# Patient Record
Sex: Male | Born: 1975
Health system: Southern US, Community
[De-identification: ages and names within clinical notes are randomized; demographics above are authoritative.]

## PROBLEM LIST (undated history)

## (undated) DIAGNOSIS — E559 Vitamin D deficiency, unspecified: Secondary | ICD-10-CM

## (undated) DIAGNOSIS — J309 Allergic rhinitis, unspecified: Secondary | ICD-10-CM

## (undated) DIAGNOSIS — B009 Herpesviral infection, unspecified: Secondary | ICD-10-CM

## (undated) DIAGNOSIS — G5621 Lesion of ulnar nerve, right upper limb: Secondary | ICD-10-CM

## (undated) DIAGNOSIS — G43909 Migraine, unspecified, not intractable, without status migrainosus: Secondary | ICD-10-CM

## (undated) DIAGNOSIS — E782 Mixed hyperlipidemia: Secondary | ICD-10-CM

## (undated) DIAGNOSIS — T7840XA Allergy, unspecified, initial encounter: Secondary | ICD-10-CM

## (undated) DIAGNOSIS — E039 Hypothyroidism, unspecified: Secondary | ICD-10-CM

## (undated) DIAGNOSIS — Z87442 Personal history of urinary calculi: Secondary | ICD-10-CM

## (undated) HISTORY — DX: Hypothyroidism, unspecified: E03.9

## (undated) HISTORY — DX: Allergy, unspecified, initial encounter: T78.40XA

## (undated) HISTORY — DX: Herpesviral infection, unspecified: B00.9

## (undated) HISTORY — DX: Lesion of ulnar nerve, right upper limb: G56.21

## (undated) HISTORY — DX: Mixed hyperlipidemia: E78.2

## (undated) HISTORY — DX: Vitamin D deficiency, unspecified: E55.9

## (undated) HISTORY — DX: Migraine, unspecified, not intractable, without status migrainosus: G43.909

## (undated) HISTORY — DX: Allergic rhinitis, unspecified: J30.9

## (undated) HISTORY — DX: Personal history of urinary calculi: Z87.442

## (undated) HISTORY — PX: NOSE SURGERY: SHX723

---

## 2012-05-24 DIAGNOSIS — E039 Hypothyroidism, unspecified: Secondary | ICD-10-CM | POA: Insufficient documentation

## 2014-06-12 DIAGNOSIS — E559 Vitamin D deficiency, unspecified: Secondary | ICD-10-CM | POA: Insufficient documentation

## 2014-06-12 DIAGNOSIS — E785 Hyperlipidemia, unspecified: Secondary | ICD-10-CM | POA: Insufficient documentation

## 2014-06-12 DIAGNOSIS — B009 Herpesviral infection, unspecified: Secondary | ICD-10-CM | POA: Insufficient documentation

## 2016-08-02 DIAGNOSIS — G43109 Migraine with aura, not intractable, without status migrainosus: Secondary | ICD-10-CM | POA: Insufficient documentation

## 2017-05-24 ENCOUNTER — Ambulatory Visit
Admission: RE | Admit: 2017-05-24 | Discharge: 2017-05-24 | Disposition: A | Discharge status: Home / Self Care | Payer: 59 | Source: Ambulatory Visit | Attending: Physician Assistant | Admitting: Physician Assistant

## 2017-05-24 ENCOUNTER — Other Ambulatory Visit: Payer: Self-pay | Admitting: Physician Assistant

## 2017-05-24 DIAGNOSIS — M79601 Pain in right arm: Secondary | ICD-10-CM

## 2017-05-24 DIAGNOSIS — G5691 Unspecified mononeuropathy of right upper limb: Secondary | ICD-10-CM

## 2017-06-19 ENCOUNTER — Other Ambulatory Visit: Payer: Self-pay | Admitting: Orthopedic Surgery

## 2017-06-19 DIAGNOSIS — M5412 Radiculopathy, cervical region: Secondary | ICD-10-CM

## 2017-06-19 DIAGNOSIS — R0789 Other chest pain: Secondary | ICD-10-CM

## 2017-06-19 DIAGNOSIS — G5621 Lesion of ulnar nerve, right upper limb: Secondary | ICD-10-CM

## 2017-07-02 ENCOUNTER — Ambulatory Visit
Admission: RE | Admit: 2017-07-02 | Discharge: 2017-07-02 | Disposition: A | Discharge status: Home / Self Care | Payer: 59 | Source: Ambulatory Visit | Attending: Orthopedic Surgery | Admitting: Orthopedic Surgery

## 2017-07-02 DIAGNOSIS — M5412 Radiculopathy, cervical region: Secondary | ICD-10-CM

## 2017-07-02 DIAGNOSIS — R0789 Other chest pain: Secondary | ICD-10-CM

## 2017-07-05 ENCOUNTER — Other Ambulatory Visit: Payer: 59

## 2017-07-07 ENCOUNTER — Other Ambulatory Visit: Payer: 59

## 2017-08-10 ENCOUNTER — Ambulatory Visit (INDEPENDENT_AMBULATORY_CARE_PROVIDER_SITE_OTHER): Payer: 59 | Admitting: Physical Therapy

## 2017-08-10 ENCOUNTER — Encounter: Payer: Self-pay | Admitting: Physical Therapy

## 2017-08-10 DIAGNOSIS — M79601 Pain in right arm: Secondary | ICD-10-CM

## 2017-08-10 DIAGNOSIS — M6281 Muscle weakness (generalized): Secondary | ICD-10-CM

## 2017-08-10 NOTE — Therapy (Signed)
[facility] [address] [place], [place], [address] Phone: [phone]   Fax:  [phone]  Physical Therapy Evaluation  Patient Details  Name: Joseph MaineJoseph Stockert MRN: [id number] Date of Birth: 17-Oct-1976 Referring Provider: Dr Dominica SeverinWilliam Gramig  Encounter Date: 08/10/2017      PT End of Session - 08/10/17 0938    Visit Number 1   Number of Visits 4   Date for PT Re-Evaluation 09/07/17   PT Start Time 0939   PT Stop Time 1023   PT Time Calculation (min) 44 min   Activity Tolerance Patient tolerated treatment well      Past Medical History:  Diagnosis Date  . Allergy   . Hypothyroid     Past Surgical History:  Procedure Laterality Date  . NOSE SURGERY      There were no vitals filed for this visit.       Subjective Assessment - 08/10/17 0939    Subjective Pt reports about 5 months ago he was playing tennis (usually plays softball) when he went to serve he felt a different feeling.  A couple days later and had difficulty throwing the ball with shooting pain into his pinky. He had numbness and edema into the hand after that, it is slowly decreasing, is just starting to get feeling back in his hand. He has been on long doses of prednisone - no relief, anti - inflammatory no help.    Pertinent History Lt sided neck injury in [employer] with wrestling.    How long can you sit comfortably? tolerates about 60 min   Diagnostic tests 3 MRIs from neck down to hand - none of these showed a tear in the nerves    Patient Stated Goals return to normal at work, play softball and golf   Currently in Pain? Yes   Pain Score 2    Pain Location Hand  ulnar side   Pain Orientation Right   Pain Type Acute pain   Pain Radiating Towards only into upper arm with certain movements   Pain Onset More than a month ago   Pain Frequency Constant   Aggravating Factors  certain movements   Pain Relieving Factors ice            [facility]  PT Assessment - 08/10/17 0001      Assessment   Medical Diagnosis Rt cervical radiculapathy, ulnar nerve restrictions   Referring Provider Dr Dominica SeverinWilliam Gramig   Onset Date/Surgical Date 03/10/17   Hand Dominance Right   Next MD Visit 10/13/17   Prior Therapy none     Precautions   Precautions None  no throwing until healed     Balance Screen   Has the patient fallen in the past 6 months No     Prior Function   Level of Independence Independent   Vocation Full time employment   Market researcherVocation Requirements desk computer job   Leisure play softball golf, play with child     Observation/Other Assessments   Focus on Therapeutic Outcomes (FOTO)  46% limited     Sensation   Light Touch Impaired by gross assessment   Hot/Cold Appears Intact   Proprioception Appears Intact     Posture/Postural Control   Posture/Postural Control No significant limitations     ROM / Strength   AROM / PROM / Strength AROM;Strength     AROM   AROM Assessment Site Shoulder;Elbow;Cervical   Right/Left Shoulder --  WNL   Right/Left Elbow --  WNL   Cervical Flexion WNL   Cervical Extension WNL   Cervical - Right Side Bend WNL   Cervical - Left Side Bend WNL   Cervical - Right Rotation WNL   Cervical - Left Rotation WNL     Strength   Strength Assessment Site Shoulder;Elbow;Hand   Right/Left Shoulder --  Lt WNL, Rt WNL except abduction/ER 4+/5   Right/Left Elbow --  Lt WNL, Rt bicep WNL, tricep 4+/5   Right/Left hand Right;Left   Right Hand Grip (lbs) 73   Left Hand Grip (lbs) 84     Flexibility   Soft Tissue Assessment /Muscle Length --  some tightness in bilat pecs     Palpation   Palpation comment nodule under arm near axilla that is very painful     Special Tests    Special Tests --  (-) spurlings             Objective measurements completed on examination: See above findings.          [facility] Adult PT Treatment/Exercise - 08/10/17 0001      Exercises   Exercises Elbow      Elbow Exercises   Other elbow exercises ulnar nerve root stretches, 5-10 reps 3 sec holds, two different ones per HEP   Other elbow exercises grip      Modalities   Modalities Cryotherapy     Cryotherapy   Number Minutes Cryotherapy 10 Minutes   Cryotherapy Location --  Rt inner arm/axilla   Type of Cryotherapy Ice pack                     PT Long Term Goals - 08/10/17 0935      PT LONG TERM GOAL #1   Title I with advanced HEP ( 09/07/17)    Time 4   Period Weeks   Status New     PT LONG TERM GOAL #2   Title report =/> 75% reduction of pain/numbness into his Rt hand( 09/07/17)    Time 4   Period Weeks   Status New     PT LONG TERM GOAL #3   Title improve FOTO =/< 30% limited ( 09/07/17)    Time 4   Period Weeks   Status New     PT LONG TERM GOAL #4   Title improve grip Rt hand to within 5# of Lt with reports of not dropping items anymore (  09/07/17)    Time 4   Period Weeks   Status New     PT LONG TERM GOAL #5   Title improve Rt shoulder abduction and ER strenght 5/5 to allow him to return to light throwing ( 09/07/17)    Time 4   Period Weeks   Status New                Plan - 08/10/17 1104    Clinical Impression Statement 41 yo male ~ 5 months s/p Rt UE nerve injury.  He reports improvement in function and sensation over the last 5 months however still has defecits with 39 UE strength and sensation.  He has (+) ulnar nerve glide restrictions on the Rt side, residual edema into the hand and a nodule on the upper inner arm that is very tender.    Clinical Presentation Evolving   Clinical Decision Making Low   Rehab Potential Excellent   PT Frequency 1x / week   PT Duration 4 weeks   PT  Treatment/Interventions Ultrasound;Therapeutic exercise;Manual techniques;Cryotherapy;Electrical Stimulation;Patient/family education;Passive range of motion   PT Next Visit Plan Rt UE strengthening, neural glides, possible [place] to nodule on inner Rt upper  arm.    Consulted and Agree with Plan of Care Patient      Patient will benefit from skilled therapeutic intervention in order to improve the following deficits and impairments:  Pain, Decreased strength, Increased edema, Impaired UE functional use  Visit Diagnosis: Pain in right arm - Plan: PT plan of care cert/re-cert  Muscle weakness (generalized) - Plan: PT plan of care cert/re-cert     Problem List There are no active problems to display for this patient.   Roderic Scarce PT  08/10/2017, 11:17 AM  [facility] [address] [place], [place], [address] Phone: [phone]   Fax:  [phone]  Name: Weslee Fogg MRN: [id number] Date of Birth: Jul 31, 1976

## 2017-08-16 ENCOUNTER — Encounter: Payer: 59 | Admitting: Physical Therapy

## 2017-08-17 ENCOUNTER — Ambulatory Visit (INDEPENDENT_AMBULATORY_CARE_PROVIDER_SITE_OTHER): Payer: 59 | Admitting: Physical Therapy

## 2017-08-17 DIAGNOSIS — M6281 Muscle weakness (generalized): Secondary | ICD-10-CM

## 2017-08-17 DIAGNOSIS — M79601 Pain in right arm: Secondary | ICD-10-CM

## 2017-08-17 NOTE — Patient Instructions (Signed)
handmaster plus  [email or web address]://www.amazon.com/Handmaster-Plus-Physical-Therapy-Exerciser/dp/B00CTG3TQU  * compression sleeve to control swelling  Adduction (Resistive Putty)    Press between 2 fingers and pull putty anchored with other hand. Repeat with all fingers. Repeat __10-30__ times. Do ____ sessions per day.   Abduction / Adduction (Active)    With hand flat on table, spread all fingers apart, then bring them together as close as possible.  Use rubber band.  Repeat __30__ times. Do __2__ sessions per day.  (Home) Extension: Short Head Triceps Press    Facing anchor, elbows against sides. Push down on bar, straightening arms, keeping elbows at sides. Repeat __10__ times per set. Do __2-3__ sets per session. Do _3___ sessions per week.  Resisted External Rotation: in Neutral - Bilateral  PALMS UP!!! Sit or stand, tubing in both hands, elbows at sides, bent to 90, forearms forward. Pinch shoulder blades together and rotate forearms out. Keep elbows at sides. Repeat __10__ times per set. Do __2-3__ sets per session. Do __3-4__ sessions per week.   Strengthening: Resisted Extension   Hold tubing with both hands, arms forward. Pull arms back, elbow straight. Repeat _10-30___ times per set. Do ____ sets per session. Do _1___ sessions per day.  Abduction: Scaption - Thumb Up (Dumbbell)    Lift arms out from sides, thumbs up.  RED BAND  Repeat _10___ times per set. Do _2-3___ sets per session. Do __3__ sessions per week.    Kinesiology tape What is kinesiology tape?  There are many brands of kinesiology tape.  KTape, Rock Eaton Corporationape, [employer]Body Sport, Dynamic tape, to name a few. It is an elasticized tape designed to support the body's natural healing process. This tape provides stability and support to muscles and joints without restricting motion. It can also help decrease swelling in the area of application. How does it work? The tape microscopically lifts and decompresses the  skin to allow for drainage of lymph (swelling) to flow away from area, reducing inflammation.  The tape has the ability to help re-educate the neuromuscular system by targeting specific receptors in the skin.  The presence of the tape increases the body's awareness of posture and body mechanics.  Do not use with: . Open wounds . Skin lesions . Adhesive allergies Safe removal of the tape: In some rare cases, mild/moderate skin irritation can occur.  This can include redness, itchiness, or hives. If this occurs, immediately remove tape and consult your primary care physician if symptoms are severe or do not resolve within 2 days.  To remove tape safely, hold nearby skin with one hand and gentle roll tape down with other hand.  You can apply oil or conditioner to tape while in shower prior to removal to loosen adhesive.  DO NOT swiftly rip tape off like a band-aid, as this could cause skin tears and additional skin irritation.

## 2017-08-17 NOTE — Therapy (Addendum)
[address] [place], [place], [address] Phone: [phone]   Fax:  [phone]  Physical Therapy Treatment  Patient Details  Name: Joseph Stephens MRN: [id number] Date of Birth: January 05, 1976 Referring Provider: Dr Roseanne Kaufman  Encounter Date: 08/17/2017      PT End of Session - 08/17/17 1119    Visit Number 2   Number of Visits 4   Date for PT Re-Evaluation 09/07/17   PT Start Time 1245   PT Stop Time 1114   PT Time Calculation (min) 56 min   Activity Tolerance Patient tolerated treatment well;No increased pain   Behavior During Therapy WFL for tasks assessed/performed      Past Medical History:  Diagnosis Date  . Allergy   . Hypothyroid     Past Surgical History:  Procedure Laterality Date  . NOSE SURGERY      There were no vitals filed for this visit.      Subjective Assessment - 08/17/17 1021    Subjective Pt reports he was working with crimp tool this weekend, running cables; had some increased swelling in RUE and increased numbness afterward.     Currently in Pain? No/denies  numbness in 4th and 5th finger of Rt hand            [facility] PT Assessment - 08/17/17 0001      Strength   Right Hand Grip (lbs) 88, 90   Left Hand Grip (lbs) 80, 82          [facility] Adult PT Treatment/Exercise - 08/17/17 0001      Self-Care   Self-Care Other Self-Care Comments   Other Self-Care Comments  Pt educated on use of compression sleeve for RUE to asist in edema control/reduction with use of RUE.  Pt verbalized understanding.      Elbow Exercises   Elbow Extension Right;10 reps;Standing  shoulder neutralx 5, at 90 deg x10, 110 deg x 10     Shoulder Exercises: Standing   External Rotation Strengthening;10 reps;Theraband  5 reps with green, too hard. moved to red   Theraband Level (Shoulder External Rotation) Level 2 (Red)   ABduction Strengthening;Right;10 reps   Theraband Level (Shoulder  ABduction) Level 2 (Red)   Other Standing Exercises Rt ulnar nerve glide x 5 reps; Rt tricep stretch x 30 sec x 2 reps      Hand Exercises   Digit Abduction/Adduction 30 x RUE   Other Hand Exercises handmaster plus- soft squeeze and finger ext x 30 reps      Modalities   Modalities Ultrasound     Ultrasound   Ultrasound Location Rt mid arm (medial side of arm) near nodule   Ultrasound Parameters 50%, 1.1w/cm2, 8 min    Ultrasound Goals Pain     Manual Therapy   Manual Therapy Taping   Manual therapy comments I strip of Rock tape applied to Rt hypothenar emminance and up medial arm to mid-arm (along line of humerus) with 15% stretch; perpendicular strip applied at wrist and over nodule with 50% stretch - tape applied to increase proprioception, decompress area                PT Education - 08/17/17 1118    Education provided Yes   Education Details HEP, info on handmaster plus and kinesiotape. pt issued red/green band, stress squeeze ball, rubber band, and piece of Rock tape.    Person(s) Educated Patient   Methods Explanation;Demonstration;Verbal cues;Handout   Comprehension  Verbalized understanding;Returned demonstration             PT Long Term Goals - 08/17/17 1120      PT LONG TERM GOAL #1   Title I with advanced HEP ( 09/07/17)    Time 4   Period Weeks   Status On-going     PT LONG TERM GOAL #2   Title report =/> 75% reduction of pain/numbness into his Rt hand( 09/07/17)    Time 4   Period Weeks   Status On-going     PT LONG TERM GOAL #3   Title improve FOTO =/< 30% limited ( 09/07/17)    Time 4   Period Weeks   Status On-going     PT LONG TERM GOAL #4   Title improve grip Rt hand to within 5# of Lt with reports of not dropping items anymore (  09/07/17)    Time 4   Period Weeks   Status Partially Met     PT LONG TERM GOAL #5   Title improve Rt shoulder abduction and ER strenght 5/5 to allow him to return to light throwing ( 09/07/17)    Time 4    Period Weeks   Status On-going               Plan - 08/17/17 1129    Clinical Impression Statement Pt demonstrated improved Rt hand strength; has partially met LTG#4.  He tolerated all exercises well without increase in symptoms.  He's making good gains towards goals.    Rehab Potential Excellent   PT Frequency 1x / week   PT Duration 4 weeks   PT Treatment/Interventions Ultrasound;Therapeutic exercise;Manual techniques;Cryotherapy;Electrical Stimulation;Patient/family education;Passive range of motion   PT Next Visit Plan assess response to [facility] tape/US/ and new HEP.   Progress RUE strengthening as tolerated.    Consulted and Agree with Plan of Care Patient      Patient will benefit from skilled therapeutic intervention in order to improve the following deficits and impairments:  Pain, Decreased strength, Increased edema, Impaired UE functional use  Visit Diagnosis: Pain in right arm  Muscle weakness (generalized)     Problem List There are no active problems to display for this patient.  Kerin Perna, PTA 08/17/17 11:32 AM  [place], [place], [address] Phone: [phone]   Fax:  [phone]  Name: Joseph Stephens MRN: [id number] Date of Birth: Oct 30, 1976   PHYSICAL THERAPY DISCHARGE SUMMARY  Visits from Start of Care: 2 Current functional level related to goals / functional outcomes: unknown   Remaining deficits: unknown   Education / Equipment: HEP Plan:                                                    Patient goals were not met. Patient is being discharged due to not returning since the last visit.  ?????     Jeral Pinch, PT 11/08/17 7:12 AM

## 2017-08-24 ENCOUNTER — Encounter: Payer: 59 | Admitting: Physical Therapy

## 2018-01-26 LAB — BASIC METABOLIC PANEL
BUN: 13 (ref 4–21)
Creatinine: 0.9 (ref 0.6–1.3)
Glucose: 95
Potassium: 4.2 (ref 3.4–5.3)
Sodium: 140 (ref 137–147)

## 2018-01-26 LAB — CBC AND DIFFERENTIAL
HCT: 42 (ref 41–53)
Hemoglobin: 14.5 (ref 13.5–17.5)
Neutrophils Absolute: 4
Platelets: 251 (ref 150–399)
WBC: 7.2

## 2018-01-26 LAB — HEPATIC FUNCTION PANEL
ALT: 32 (ref 10–40)
AST: 25 (ref 14–40)
Alkaline Phosphatase: 66 (ref 25–125)
Bilirubin, Total: 0.6

## 2018-01-26 LAB — LIPID PANEL
Cholesterol: 166 (ref 0–200)
HDL: 55 (ref 35–70)
LDL Cholesterol: 88
Triglycerides: 116 (ref 40–160)

## 2018-01-26 LAB — VITAMIN D 25 HYDROXY (VIT D DEFICIENCY, FRACTURES): Vit D, 25-Hydroxy: 37.1

## 2018-01-26 LAB — TSH: TSH: 2.27 (ref 0.41–5.90)

## 2018-05-02 DIAGNOSIS — M79641 Pain in right hand: Secondary | ICD-10-CM | POA: Insufficient documentation

## 2018-05-02 DIAGNOSIS — G5620 Lesion of ulnar nerve, unspecified upper limb: Secondary | ICD-10-CM | POA: Insufficient documentation

## 2018-11-12 MED FILL — LEVOTHYROXINE 50 MCG TABLET: 50 | 90 days supply | Qty: 90 | Fill #0

## 2018-11-27 IMAGING — MR MR HUMERUS*R* W/O CM
4 of 5 series · 26 of 40 positions shown · non-contrast
Comparison: None.

CLINICAL DATA: Right medial humerus pain and numbness radiating to
the pinky and finger x6 +weeks.

EXAM:
MRI OF THE RIGHT HUMERUS WITHOUT CONTRAST
TECHNIQUE: Multiplanar, multisequence MR imaging of the right humerus was
performed. No intravenous contrast was administered.

[Series 4: T2 fat-sat · axial · right · 5.5mm · 0.67mm/px · z∈[-227,+127]mm · 9 of 51 slices shown (1 of 3)]
[im 1/51]
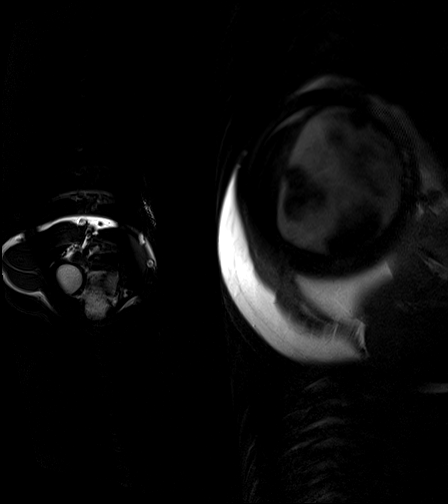
[im 10/51]
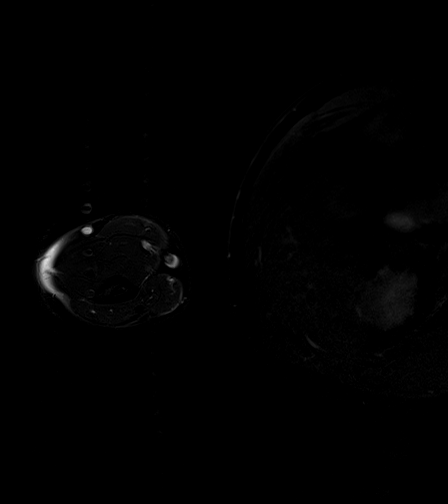
[im 14/51]
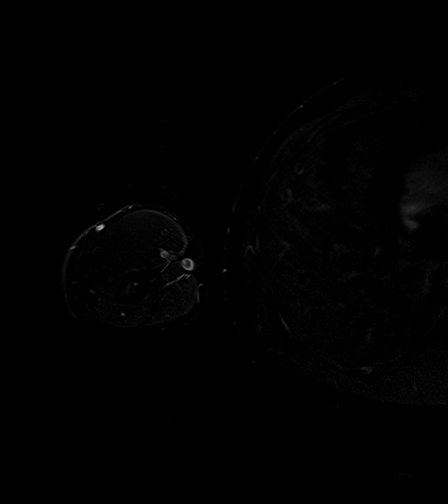
[im 23/51]
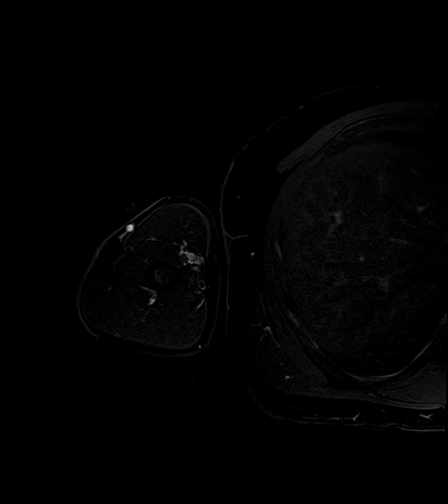
[im 28/51]
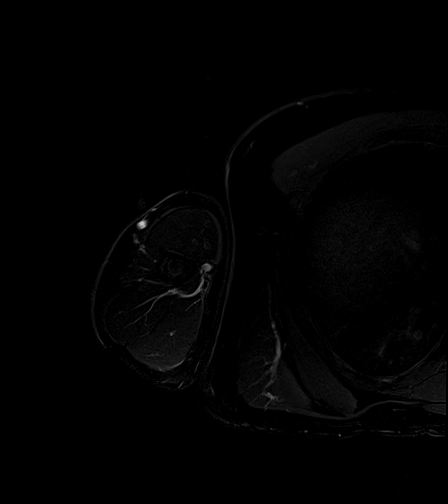
[im 37/51]
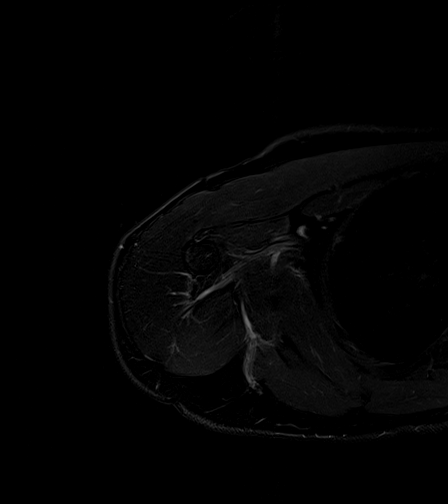
[im 41/51]
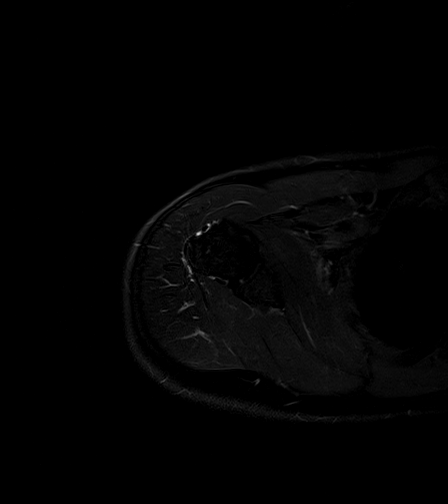
[im 46/51]
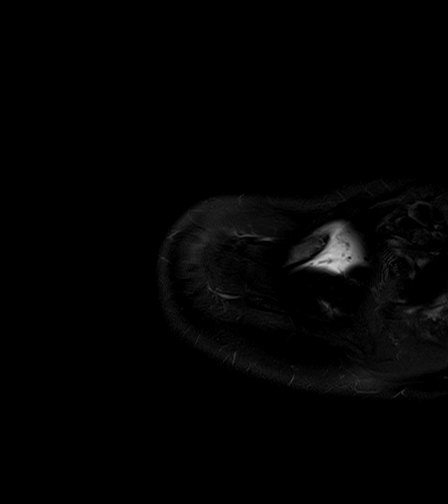
[im 51/51]
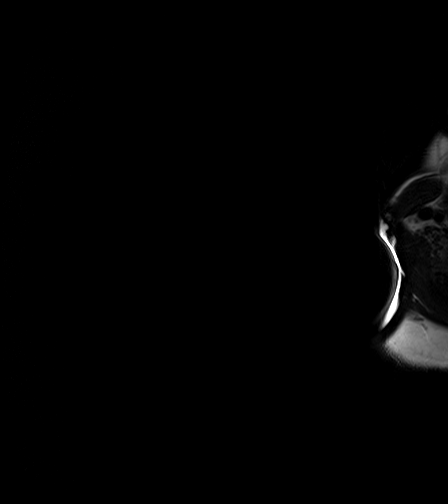

[Series 5: T1 · sagittal · right · 3.0mm · 0.85mm/px · 3 of 29 slices shown]
[im 5/29]
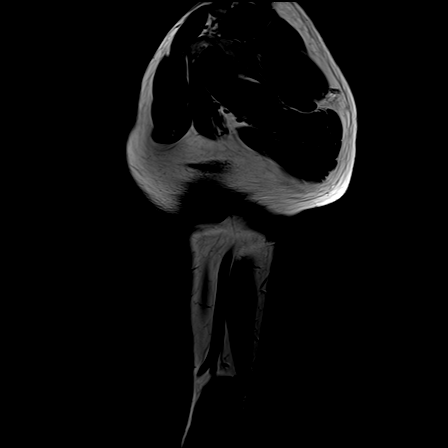
[im 15/29]
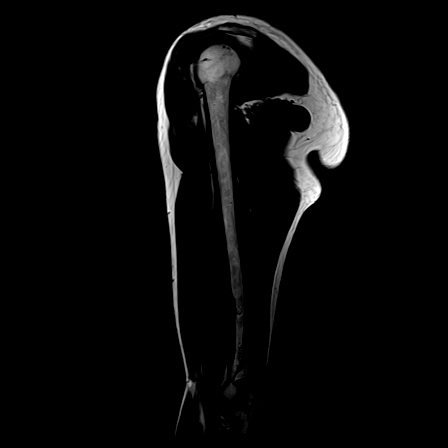
[im 24/29]
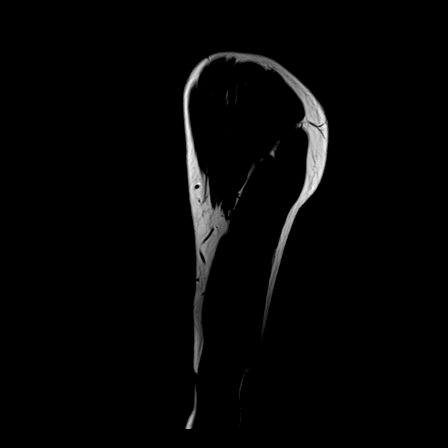

[Series 6: T2 fat-sat · sagittal · right · 3.0mm · 0.85mm/px · 7 of 29 slices shown (2 of 3)]
[im 1/29]
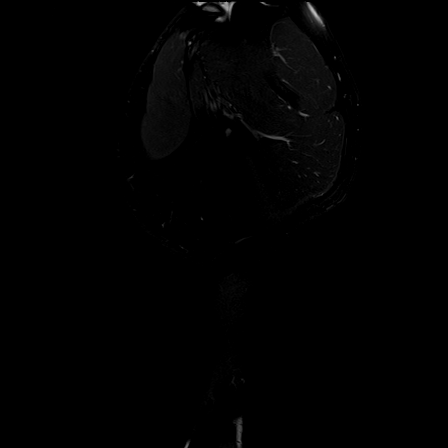
[im 5/29]
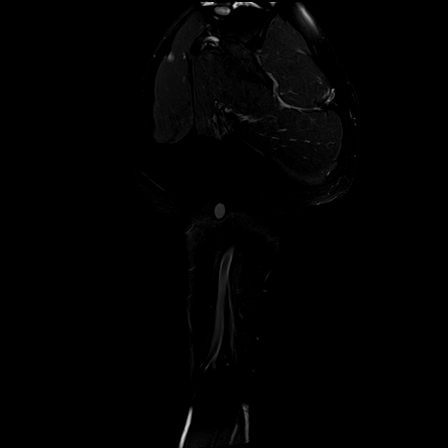
[im 10/29]
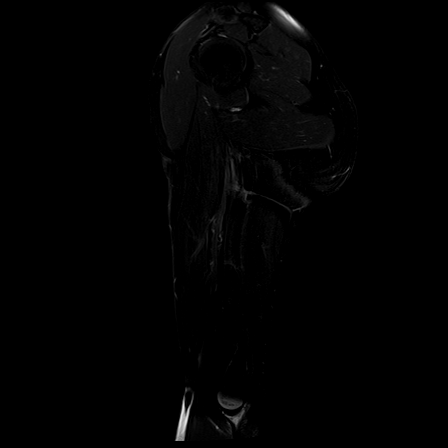
[im 15/29]
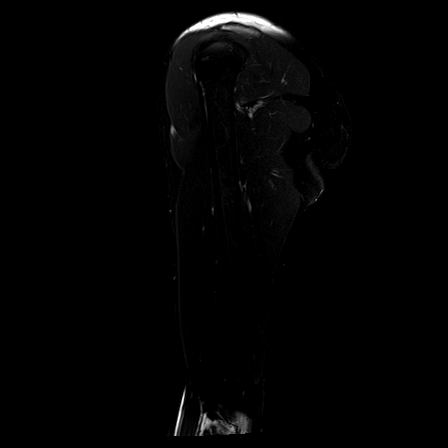
[im 19/29]
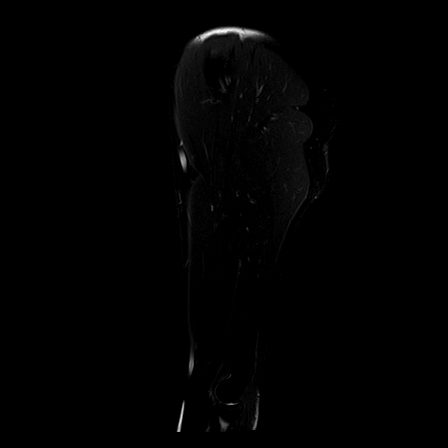
[im 24/29]
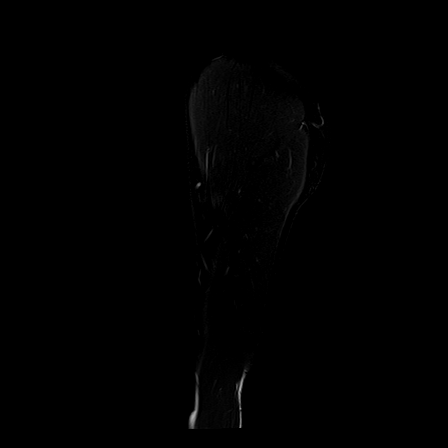
[im 29/29]
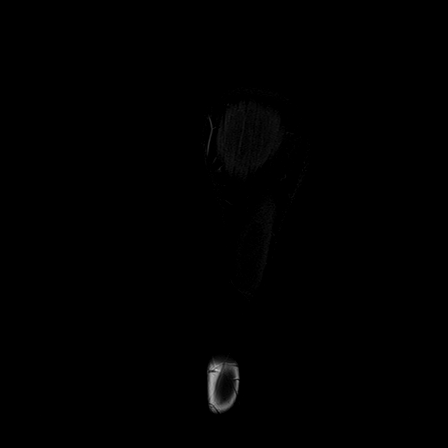

[Series 8: T2 fat-sat · coronal · right · 3.0mm · 0.85mm/px · 7 of 28 slices shown (3 of 3)]
[im 1/28]
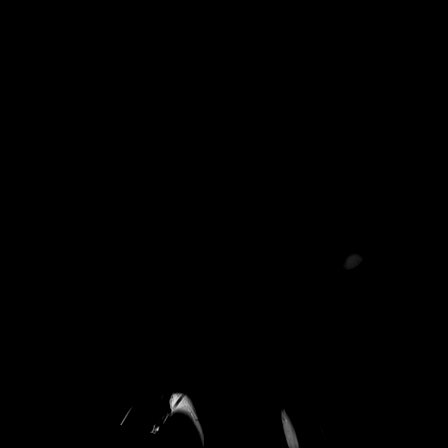
[im 5/28]
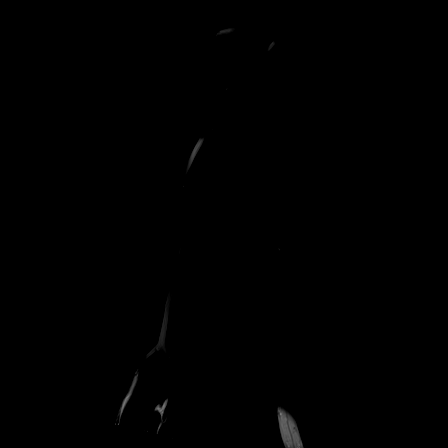
[im 10/28]
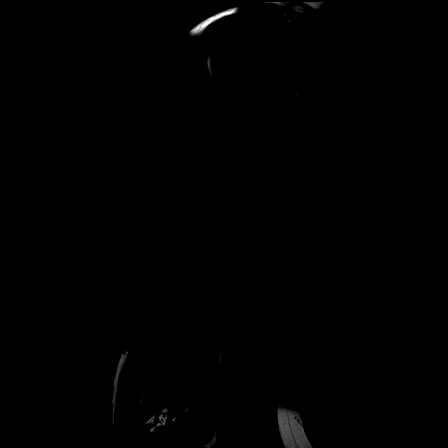
[im 14/28]
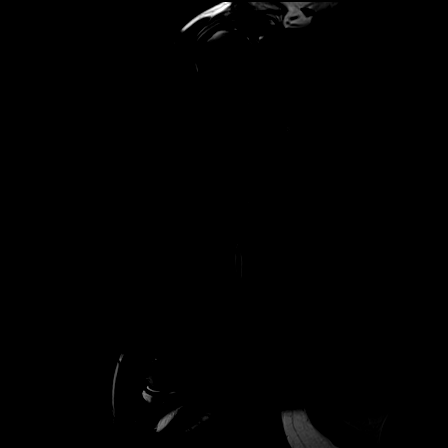
[im 19/28]
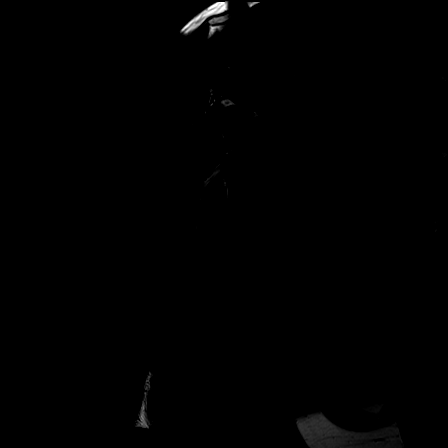
[im 23/28]
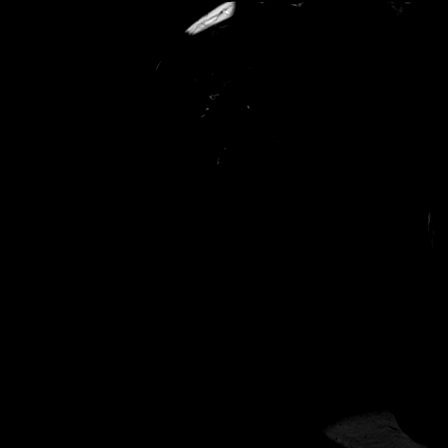
[im 28/28]
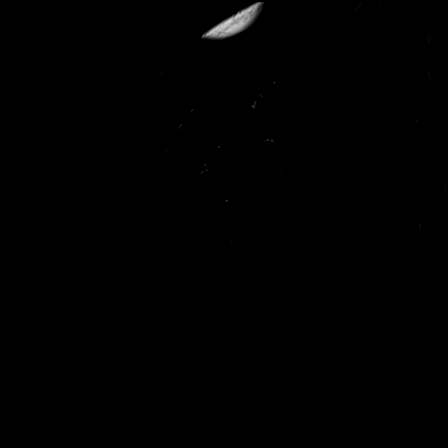

[26 of 40 positions shown; findings below may reference images not displayed]

FINDINGS: Bones/Joint/Cartilage

No marrow signal abnormalities. No fracture nor bone destruction. No
primary osseous lesions. No focal chondral defect within the
glenohumeral joint. Rotator cuff appears intact. The biceps tendon
is seated within the biceps groove. No chondral defects are noted.

Ligaments

Negative

Muscles and Tendons

No muscle atrophy or edema. No evidence of focal fluid collection or
abscess.

Soft tissues

Negative
IMPRESSION: Unremarkable MRI of the right humerus.

## 2018-12-17 MED FILL — ROSUVASTATIN CALCIUM 10 MG: 10 | 30 days supply | Qty: 15 | Fill #0

## 2019-01-10 MED FILL — ROSUVASTATIN CALCIUM 10 MG: 10 | 30 days supply | Qty: 15 | Fill #1

## 2019-02-22 MED FILL — ROSUVASTATIN CALCIUM 10 MG: 10 | 30 days supply | Qty: 15 | Fill #2

## 2019-03-11 MED FILL — LEVOTHYROXINE 50 MCG TABLET: 50 | 30 days supply | Qty: 30 | Fill #0

## 2019-03-12 MED FILL — ROSUVASTATIN CALCIUM 10 MG: 10 | 30 days supply | Qty: 15 | Fill #0

## 2019-04-10 MED FILL — LEVOTHYROXINE 50 MCG TABLET: 50 | 30 days supply | Qty: 30 | Fill #1

## 2019-04-10 MED FILL — ROSUVASTATIN CALCIUM 10 MG: 10 | 30 days supply | Qty: 15 | Fill #1

## 2019-05-20 MED FILL — LEVOTHYROXINE 50 MCG TABLET: 50 | 30 days supply | Qty: 30 | Fill #2

## 2019-05-20 MED FILL — ROSUVASTATIN CALCIUM 10 MG: 10 | 30 days supply | Qty: 15 | Fill #2

## 2019-06-28 LAB — BASIC METABOLIC PANEL
BUN: 18 (ref 4–21)
Creatinine: 1 (ref 0.6–1.3)
Glucose: 96
Potassium: 4.4 (ref 3.4–5.3)
Sodium: 139 (ref 137–147)

## 2019-06-28 LAB — LIPID PANEL
Cholesterol: 215 — AB (ref 0–200)
HDL: 66 (ref 35–70)
LDL Cholesterol: 125
Triglycerides: 122 (ref 40–160)

## 2019-06-28 LAB — HEPATIC FUNCTION PANEL
ALT: 22 (ref 10–40)
AST: 19 (ref 14–40)
Alkaline Phosphatase: 75 (ref 25–125)
Bilirubin, Total: 0.8

## 2019-06-28 LAB — CBC AND DIFFERENTIAL
HCT: 47 (ref 41–53)
Hemoglobin: 15.7 (ref 13.5–17.5)
Neutrophils Absolute: 6
Platelets: 283 (ref 150–399)
WBC: 8.3

## 2019-06-28 LAB — VITAMIN D 25 HYDROXY (VIT D DEFICIENCY, FRACTURES): Vit D, 25-Hydroxy: 42.7

## 2019-06-28 LAB — TSH: TSH: 4.47 (ref 0.41–5.90)

## 2019-06-28 MED FILL — LEVOTHYROXINE 50 MCG TABLET: 50 | 15 days supply | Qty: 15 | Fill #0

## 2019-06-28 MED FILL — ROSUVASTATIN CALCIUM 10 MG: 10 | 30 days supply | Qty: 15 | Fill #0

## 2019-07-18 ENCOUNTER — Other Ambulatory Visit: Payer: Self-pay

## 2019-07-18 ENCOUNTER — Encounter: Payer: Self-pay | Admitting: Family Medicine

## 2019-07-18 ENCOUNTER — Ambulatory Visit (INDEPENDENT_AMBULATORY_CARE_PROVIDER_SITE_OTHER): Payer: 59 | Admitting: Family Medicine

## 2019-07-18 VITALS — BP 122/81 | HR 78 | Temp 98.7°F | Resp 16 | Ht 66.0 in | Wt 165.0 lb

## 2019-07-18 DIAGNOSIS — E782 Mixed hyperlipidemia: Secondary | ICD-10-CM

## 2019-07-18 DIAGNOSIS — G5621 Lesion of ulnar nerve, right upper limb: Secondary | ICD-10-CM | POA: Diagnosis not present

## 2019-07-18 DIAGNOSIS — R200 Anesthesia of skin: Secondary | ICD-10-CM | POA: Diagnosis not present

## 2019-07-18 DIAGNOSIS — R202 Paresthesia of skin: Secondary | ICD-10-CM

## 2019-07-18 DIAGNOSIS — E039 Hypothyroidism, unspecified: Secondary | ICD-10-CM

## 2019-07-18 DIAGNOSIS — R252 Cramp and spasm: Secondary | ICD-10-CM

## 2019-07-18 MED ORDER — LEVOTHYROXINE SODIUM 50 MCG PO TABS: 50.0000 ug | ORAL_TABLET | Freq: Every day | ORAL | 11 refills | Status: DC

## 2019-07-18 MED ORDER — ROSUVASTATIN CALCIUM 10 MG PO TABS: 10.0000 mg | ORAL_TABLET | Freq: Every day | ORAL | 11 refills | Status: DC

## 2019-07-18 MED FILL — LEVOTHYROXINE 50 MCG TABLET: 50 | 30 days supply | Qty: 30 | Fill #0

## 2019-07-18 MED FILL — ROSUVASTATIN CALCIUM 10 MG: 10 | 30 days supply | Qty: 30 | Fill #0

## 2019-07-18 NOTE — Progress Notes (Signed)
Office Note 07/18/2019  CC:  Chief Complaint  Patient presents with  . Establish Care    Previous PCP Lindley MagnusAshley Long    HPI:  Joseph MaineJoseph Stephens is a 43 y.o. male who is here to establish care, f/u hypothyroidism and hypercholesterolemia. Patient's most recent primary MD: Betsey HolidayAshley Michaels, [facility] [place].  Old records in EPIC/HL were reviewed prior to or during today's visit.  Most recent f/u at prior PCP was 2 wks ago->got CPE and labs. Reviewed labs (06/28/19) that pt brought with him today and they'll be scanned into EMR: TSH 4.5, Tchol 215, HDL 66, LDL 125, trigs 122. Vit D level 43. CMET and CBC w/diff normal.  No acute complaints. Due for med RF's. Has been on crestor for years.  Changed diet to lower carb.  Dose was decreased to 5mg  and levels went up, so 10mg  qd has been a better dose for him.  Has been on thyroid supplement for 10 yrs or so: fatigue and scattered myalgias/arthralgias x 2 yrs.  Synthroid brought complete resolution of sx's.  Long hx of ulnar neuropathy. Still numb in R hand in distribution of ulnar nerve, some muscle cramps in L hand, particularly when using keyboard/mouse for an hour or so. Asks for referral to physical therapist [facility]Stacey [facility] [place] for consideration of the dry needling that she does.   Past Medical History:  Diagnosis Date  . Allergic rhinitis   . History of kidney stones   . Hypercholesterolemia   . Hypothyroidism   . Migraine syndrome   . Ulnar neuropathy of right upper extremity    x years, unknown etiology, big w/u was done but no site of entrapment was determined.     Past Surgical History:  Procedure Laterality Date  . NOSE SURGERY      Family History  Problem Relation Age of Onset  . Depression Mother   . Drug abuse Mother   . Mental illness Mother   . High Cholesterol Father   . High blood pressure Father   . Alcohol abuse Maternal Grandfather   . Heart attack Paternal Grandmother   .  Heart disease Paternal Grandmother   . Pancreatic cancer Paternal Grandfather     Social History   Socioeconomic History  . Marital status: Married    Spouse name: Not on file  . Number of children: Not on file  . Years of education: Not on file  . Highest education level: Not on file  Occupational History  . Not on file  Social Needs  . Financial resource strain: Not on file  . Food insecurity    Worry: Not on file    Inability: Not on file  . Transportation needs    Medical: Not on file    Non-medical: Not on file  Tobacco Use  . Smoking status: Never Smoker  . Smokeless tobacco: Never Used  Substance and Sexual Activity  . Alcohol use: Yes    Alcohol/week: 2.0 - 3.0 standard drinks    Types: 2 - 3 Glasses of wine per week    Comment: occasionally  . Drug use: Never  . Sexual activity: Yes  Lifestyle  . Physical activity    Days per week: Not on file    Minutes per session: Not on file  . Stress: Not on file  Relationships  . Social Musicianconnections    Talks on phone: Not on file    Gets together: Not on file    Attends  religious service: Not on file    Active member of club or organization: Not on file    Attends meetings of clubs or organizations: Not on file    Relationship status: Not on file  . Intimate partner violence    Fear of current or ex partner: Not on file    Emotionally abused: Not on file    Physically abused: Not on file    Forced sexual activity: Not on file  Other Topics Concern  . Not on file  Social History Narrative   Married, 1 daughter (67 y/o as of 07/2019).   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   His wife is a PA in [place] [employer].   Orig from [place]   No tob.   Alc: occ.    Outpatient Encounter Medications as of 07/18/2019  Medication Sig  . Cholecalciferol (VITAMIN D) 50 MCG (2000 UT) tablet Take 5000 IU daily.  Marland Kitchen levothyroxine (SYNTHROID) 50 MCG tablet Take 1 tablet (50  mcg total) by mouth daily before breakfast.  . Multiple Vitamin (MULTIVITAMIN) tablet Take by mouth.  . rosuvastatin (CRESTOR) 10 MG tablet Take 1 tablet (10 mg total) by mouth daily.  . [DISCONTINUED] levothyroxine (SYNTHROID, LEVOTHROID) 50 MCG tablet Take 50 mcg by mouth daily before breakfast.  . [DISCONTINUED] rosuvastatin (CRESTOR) 10 MG tablet Take 10 mg by mouth daily.   No facility-administered encounter medications on file as of 07/18/2019.     No Known Allergies  ROS Review of Systems  Constitutional: Negative for fatigue and fever.  HENT: Negative for congestion and sore throat.   Eyes: Negative for visual disturbance.  Respiratory: Negative for cough.   Cardiovascular: Negative for chest pain.  Gastrointestinal: Negative for abdominal pain and nausea.  Genitourinary: Negative for dysuria.  Musculoskeletal: Negative for back pain and joint swelling.  Skin: Negative for rash.  Neurological: Negative for weakness and headaches.  Hematological: Negative for adenopathy.    PE; Blood pressure 122/81, pulse 78, temperature 98.7 F (37.1 C), temperature source Temporal, resp. rate 16, height 5\' 6"  (1.676 m), weight 165 lb (74.8 kg), SpO2 98 %. Body mass index is 26.63 kg/m.  Gen: Alert, well appearing.  Patient is oriented to person, place, time, and situation. AFFECT: pleasant, lucid thought and speech. WUJ:WJXB: no injection, icteris, swelling, or exudate.  EOMI, PERRLA. Mouth: lips without lesion/swelling.  Oral mucosa pink and moist. Oropharynx without erythema, exudate, or swelling.  Neck - No masses or thyromegaly or limitation in range of motion CV: RRR, no m/r/g.   LUNGS: CTA bilat, nonlabored resps, good aeration in all lung fields. EXT: no clubbing or cyanosis.  no edema.   Pertinent labs:  No results found for: TSH No results found for: WBC, HGB, HCT, MCV, PLT No results found for: CREATININE, BUN, NA, K, CL, CO2 No results found for: ALT, AST, GGT, ALKPHOS,  BILITOT No results found for: CHOL No results found for: HDL No results found for: LDLCALC No results found for: TRIG No results found for: CHOLHDL No results found for: PSA  ASSESSMENT AND PLAN:   New pt, estab care.  No prior records needed at this time.  1) Hypothyroidism: well controlled. RF'd T4, 88 mcg qd, #30, RF x 11.  2) Hyperlipidemia, mixed: most recent FLP fine. Pt will be getting back into better dietary and exercise habits and says historically 10mg  qd crestor dosing has been the best treatment for him.  3) Right ulnar  neuropathy: having chronic sx's at the level of the hand. Upper arm and forearm/wrist sx's have gradually resolved per his report. He notes a long/expensive w/u was done and no site of ulnar nerve entrapment was identified. As per his request today I referred [facility] [place] with Nani GasserStacey Simpson for consideration of dry needling.  An After Visit Summary was printed and given to the patient.  Return in about 1 year (around 07/17/2020) for annual CPE (fasting).  Signed:  Santiago BumpersPhil , MD           07/18/2019

## 2019-08-01 ENCOUNTER — Ambulatory Visit: Payer: 59 | Attending: Family Medicine | Admitting: Physical Therapy

## 2019-08-01 ENCOUNTER — Other Ambulatory Visit: Payer: Self-pay

## 2019-08-01 ENCOUNTER — Encounter: Payer: Self-pay | Admitting: Physical Therapy

## 2019-08-01 DIAGNOSIS — M25531 Pain in right wrist: Secondary | ICD-10-CM

## 2019-08-01 DIAGNOSIS — M79601 Pain in right arm: Secondary | ICD-10-CM

## 2019-08-01 DIAGNOSIS — R202 Paresthesia of skin: Secondary | ICD-10-CM

## 2019-08-01 NOTE — Therapy (Addendum)
[facility] [address], [place], [place], [address] Phone: [phone]   Fax:  [phone]  Physical Therapy Evaluation/Discharge Summary   Patient Details  Name: Joseph Stephens MRN: [id number] Date of Birth: Apr 03, 1976 Referring Provider (PT): Dr. Shawnie Dapper    Encounter Date: 08/01/2019  PT End of Session - 08/01/19 1308    Visit Number  1    Date for PT Re-Evaluation  09/26/19    PT Start Time  1220    PT Stop Time  1300    PT Time Calculation (min)  40 min    Activity Tolerance  Patient tolerated treatment well       Past Medical History:  Diagnosis Date  . Allergic rhinitis   . History of kidney stones   . Hyperlipemia, mixed    dx'd approx age 50 (+FH)  . Hypothyroidism dx'd approx age 30  . Migraine syndrome   . Ulnar neuropathy of right upper extremity    x years, unknown etiology, big w/u was done but no site of entrapment was determined.     Past Surgical History:  Procedure Laterality Date  . NOSE SURGERY      There were no vitals filed for this visit.   Subjective Assessment - 08/01/19 1226    Subjective  2 years ago had onset of posterior upper arm pain while playing tennis;  After that had weakness and shooting pain in ulnar distribution;  Unable to throw softball;  has had some improvement in strength but 4th and 5th finger numbness is constant;  Ulnar side pain/burning at the wrist.    Pertinent History  right hand dominant    Limitations  House hold activities    Diagnostic tests  MRI of neck DDD LEFT side and right shoulder (RTC intact);  2 NCVs "microtear of ulnar nerve" 2 years ago    Patient Stated Goals  relief of wrist symptoms and numbness    Currently in Pain?  Yes    Pain Score  2     Pain Location  Wrist    Pain Orientation  Right    Pain Type  Neuropathic pain    Pain Radiating Towards  4th and 5th fingers dorsal and volar    Pain Onset  More than a month ago     Pain Frequency  Constant    Aggravating Factors   neural tension; lying on right side;  power tools;  tying, using the mouse         [facility] PT Assessment - 08/01/19 0001      Assessment   Medical Diagnosis  right ulnar neuropathy     Referring Provider (PT)  Dr. Shawnie Dapper     Onset Date/Surgical Date  --   2 years ago    Hand Dominance  Right    Next MD Visit  as needed     Prior Therapy  focus on right UE strengthening       Precautions   Precautions  None      Restrictions   Weight Bearing Restrictions  No      Balance Screen   Has the patient fallen in the past 6 months  No    Has the patient had a decrease in activity level because of a fear of falling?   No    Is the patient reluctant to leave their home because of a fear of falling?   No      Home  [employer]video [place] residence    Living Arrangements  Spouse/significant other;Children      Posture/Postural Control   Posture/Postural Control  No significant limitations    Posture Comments  Minimal hypothenar atrophy       AROM   Overall AROM Comments  Full and symptom free cervical ROM       Strength   Right Shoulder Flexion  5/5    Right Shoulder ABduction  5/5    Right Shoulder Internal Rotation  5/5    Right Shoulder External Rotation  5/5    Right/Left Elbow  Right    Right Elbow Flexion  5/5    Right Elbow Extension  4+/5    Right/Left Wrist  Right    Right Wrist Flexion  4/5   painful    Right Wrist Extension  4/5   painful    Right/Left hand  Right    Right Hand Grip (lbs)  100   left 80 #     Palpation   Palpation comment  mild tender points triceps and flexor carpi ulnaris       Special Tests   Other special tests  paper pinch test symmetrical without hyperflexion of DIP;  negative Tinels at elbow       Spurling's   Findings  Negative      other    Comment  neural tension with ULTT and "birdman" position;  + pain with wrist extension                  Objective measurements completed on examination: See above findings.      [facility] Adult PT Treatment/Exercise - 08/01/19 0001      Manual Therapy   Soft tissue mobilization  right triceps and medial forearm        Trigger Point Dry Needling - 08/01/19 0001    Consent Given?  Yes    Education Handout Provided  Previously provided    Muscles Treated Wrist/Hand  Flexor carpi ulnaris    Other Dry Needling  right     Flexor carpi ulnaris Response  Palpable increased muscle length           PT Education - 08/01/19 1303    Education Details  DN info;  limited neural flossing 10x once a day only;  no compression (no leaning of forearm and wrist); no sleeping on right side    Person(s) Educated  Patient    Methods  Explanation;Demonstration;Handout    Comprehension  Returned demonstration;Verbalized understanding       PT Short Term Goals - 08/01/19 2151      PT SHORT TERM GOAL #1   Title  see LTGs        PT Long Term Goals - 08/01/19 2151      PT LONG TERM GOAL #1   Title  I with advanced HEP    Time  6    Period  Weeks    Status  New    Target Date  09/12/19      PT LONG TERM GOAL #2   Title  report =/>30 % reduction of pain/numbness in wrist and hand with using the computer mouse and typing    Time  6    Period  Weeks    Status  New      PT LONG TERM GOAL #3   Title  The patient will have improved functional strength to grossly 4+/5 and decreased incidence of dropping  things to 1-2x/week    Time  6    Period  Weeks    Status  New      PT LONG TERM GOAL #4   Title  The patient will be able to put 75% of neural tension on ulnar nerve with minimal wrist/hand pain    Time  6    Period  Weeks    Status  New      PT LONG TERM GOAL #5   Title  .Marland KitchenMarland Kitchen             Plan - 08/01/19 2136    Clinical Impression Statement  The patient reports a 2 year history of right UE radiating pain in the ulnar nerve distribution with constant 4th and  5th finger numbness on a constant basis.  He reports a worsening of symptoms with typing, using the mouse, night time, using tools and neural tension maneuvers particularly wrist extension.  Negative cervical, shoulder screen.  Negative Tinels at elbow. + tension tests with wrist extension in particular.  Tender points flexor carpi ulnaris.  His grip and pincher strength is good with minimal hypothenar eminence atrophy although he reports he frequently drops things several times per week.    He is interested in DN as he had this treatment for a shoulder musculature injury years ago which really helped.  Recommend trial of PT for DN in conjunction with manual therapy, exercise and education.    Personal Factors and Comorbidities  Past/Current Experience    Examination-Activity Limitations  Other    Examination-Participation Restrictions  Yard Work    Stability/Clinical Decision Making  Stable/Uncomplicated    Clinical Decision Making  Low    Rehab Potential  Good    PT Frequency  1x / week    PT Duration  6 weeks    PT Treatment/Interventions  ADLs/Self Care Home Management;Iontophoresis 8m/ml Dexamethasone;Moist Heat;Electrical Stimulation;Cryotherapy;Ultrasound;Therapeutic exercise;Therapeutic activities;Neuromuscular re-education;Manual techniques;Patient/family education;Dry needling;Taping    PT Next Visit Plan  assess response to DN#1; gentle neural flossing, education    Consulted and Agree with Plan of Care  Patient       Patient will benefit from skilled therapeutic intervention in order to improve the following deficits and impairments:  Increased fascial restricitons, Impaired UE functional use, Impaired perceived functional ability, Pain, Impaired sensation  Visit Diagnosis: Pain in right wrist - Plan: PT plan of care cert/re-cert  Paresthesia of skin - Plan: PT plan of care cert/re-cert  Pain in right arm - Plan: PT plan of care cert/re-cert     PHYSICAL THERAPY DISCHARGE  SUMMARY  Visits from Start of Care: 1  Current functional level related to goals / functional outcomes: The patient cancelled follow up appts secondary to financial concerns/ hight insurance deductible.   Remaining deficits: As above   Education / Equipment: Basic self care Plan: Patient agrees to discharge.  Patient goals were not met. Patient is being discharged due to financial reasons.  ?????    Problem List Patient Active Problem List   Diagnosis Date Noted  . Migraine with aura and without status migrainosus, not intractable 08/02/2016  . Dyslipidemia 06/12/2014  . Recurrent HSV (herpes simplex virus) 06/12/2014  . Vitamin D deficiency 06/12/2014  . Acquired hypothyroidism 05/24/2012   SRuben Im PT 08/01/19 10:01 PM Phone: [phone]: [phone] Singh8/20/2020, 10:01 PM  [facility] [address] [place] [address]: [phone]  Fax:  [phone] Name:  Joseph Stephens MRN: [id number] Date of Birth: [birth year]

## 2019-08-01 NOTE — Patient Instructions (Signed)
     Trigger Point Dry Needling  . What is Trigger Point Dry Needling (DN)? o DN is a physical therapy technique used to treat muscle pain and dysfunction. Specifically, DN helps deactivate muscle trigger points (muscle knots).  o A thin filiform needle is used to penetrate the skin and stimulate the underlying trigger point. The goal is for a local twitch response (LTR) to occur and for the trigger point to relax. No medication of any kind is injected during the procedure.   . What Does Trigger Point Dry Needling Feel Like?  o The procedure feels different for each individual patient. Some patients report that they do not actually feel the needle enter the skin and overall the process is not painful. Very mild bleeding may occur. However, many patients feel a deep cramping in the muscle in which the needle was inserted. This is the local twitch response.   . How Will I feel after the treatment? o Soreness is normal, and the onset of soreness may not occur for a few hours. Typically this soreness does not last longer than two days.  o Bruising is uncommon, however; ice can be used to decrease any possible bruising.  o In rare cases feeling tired or nauseous after the treatment is normal. In addition, your symptoms may get worse before they get better, this period will typically not last longer than 24 hours.   . What Can I do After My Treatment? o Increase your hydration by drinking more water for the next 24 hours. o You may place ice or heat on the areas treated that have become sore, however, do not use heat on inflamed or bruised areas. Heat often brings more relief post needling. o You can continue your regular activities, but vigorous activity is not recommended initially after the treatment for 24 hours. o DN is best combined with other physical therapy such as strengthening, stretching, and other therapies.    Kirstyn Lean PT [facility] [address], [address] , [place] [address] Phone # [phone] Fax [phone] 

## 2019-08-15 ENCOUNTER — Ambulatory Visit: Payer: 59 | Attending: Family Medicine | Admitting: Physical Therapy

## 2019-08-15 ENCOUNTER — Other Ambulatory Visit: Payer: Self-pay

## 2019-08-15 MED FILL — ROSUVASTATIN CALCIUM 10 MG: 10 | 30 days supply | Qty: 30 | Fill #1

## 2019-08-15 MED FILL — LEVOTHYROXINE 50 MCG TABLET: 50 | 30 days supply | Qty: 30 | Fill #1

## 2019-08-23 ENCOUNTER — Ambulatory Visit: Payer: 59 | Admitting: Physical Therapy

## 2019-08-28 ENCOUNTER — Encounter: Payer: Self-pay | Admitting: Family Medicine

## 2019-08-29 ENCOUNTER — Encounter: Payer: 59 | Admitting: Physical Therapy

## 2019-09-05 ENCOUNTER — Encounter: Payer: 59 | Admitting: Physical Therapy

## 2019-09-17 ENCOUNTER — Encounter: Payer: Self-pay | Admitting: Family Medicine

## 2019-09-24 MED FILL — LEVOTHYROXINE 50 MCG TABLET: 50 | 30 days supply | Qty: 30 | Fill #2

## 2019-11-01 MED FILL — ROSUVASTATIN CALCIUM 10 MG: 10 | 30 days supply | Qty: 30 | Fill #2

## 2019-11-01 MED FILL — LEVOTHYROXINE 50 MCG TABLET: 50 | 30 days supply | Qty: 30 | Fill #3

## 2019-11-26 MED FILL — ROSUVASTATIN CALCIUM 10 MG: 10 | 30 days supply | Qty: 30 | Fill #3

## 2019-11-26 MED FILL — LEVOTHYROXINE 50 MCG TABLET: 50 | 30 days supply | Qty: 30 | Fill #4

## 2020-01-06 MED FILL — LEVOTHYROXINE 50 MCG TABLET: 50 | 30 days supply | Qty: 30 | Fill #5

## 2020-02-13 MED FILL — LEVOTHYROXINE 50 MCG TABLET: 50 | 30 days supply | Qty: 30 | Fill #6

## 2020-03-17 MED FILL — LEVOTHYROXINE 50 MCG TABLET: 50 | 30 days supply | Qty: 30 | Fill #7

## 2020-05-26 MED FILL — LEVOTHYROXINE 50 MCG TABLET: 50 | 30 days supply | Qty: 30 | Fill #9

## 2020-05-26 MED FILL — ROSUVASTATIN CALCIUM 10 MG: 10 | 30 days supply | Qty: 30 | Fill #5

## 2020-07-01 MED FILL — LEVOTHYROXINE 50 MCG TABLET: 50 | 30 days supply | Qty: 30 | Fill #10

## 2020-08-07 ENCOUNTER — Other Ambulatory Visit: Payer: Self-pay | Admitting: Family Medicine

## 2020-08-07 MED FILL — KETOCONAZOLE 2% SHAMPOO: 2 | 30 days supply | Qty: 120 | Fill #0

## 2020-08-07 MED FILL — LEVOTHYROXINE 50 MCG TABLET: 50 | 30 days supply | Qty: 30 | Fill #0

## 2020-08-07 MED FILL — FLUOCINONIDE 0.05% SOLUTION: 0.05 | 14 days supply | Qty: 60 | Fill #0

## 2020-08-13 ENCOUNTER — Other Ambulatory Visit: Payer: Self-pay

## 2020-08-14 ENCOUNTER — Ambulatory Visit: Payer: 59 | Admitting: Family Medicine

## 2020-08-14 DIAGNOSIS — Z0289 Encounter for other administrative examinations: Secondary | ICD-10-CM

## 2020-09-23 ENCOUNTER — Other Ambulatory Visit: Payer: Self-pay | Admitting: Family Medicine

## 2020-09-23 MED FILL — ROSUVASTATIN CALCIUM 10 MG: 10 | 30 days supply | Qty: 30 | Fill #0

## 2020-09-23 MED FILL — LEVOTHYROXINE 50 MCG TABLET: 50 | 30 days supply | Qty: 30 | Fill #0

## 2020-09-23 NOTE — Telephone Encounter (Signed)
Patient has 2 pills left of Synthroid. He has scheduled an appointment at first opening . Can enough medication be sent in to last until his appt?

## 2020-09-23 NOTE — Telephone Encounter (Signed)
Please advise on grace period

## 2020-09-28 ENCOUNTER — Ambulatory Visit: Payer: 59 | Admitting: Family Medicine

## 2020-09-28 NOTE — Progress Notes (Deleted)
OFFICE VISIT  09/28/2020  CC: No chief complaint on file.   HPI:    Patient is a 44 y.o. male who presents for f/u hypothyroidism and hyperlipidemia. I last saw him 14 months ago at his "establish care" visit. I kept him on same synthroid and crestor dose at that time.  INTERIM HX: ***  Past Medical History:  Diagnosis Date  . Allergic rhinitis   . History of kidney stones   . Hyperlipemia, mixed    dx'd approx age 31 (+FH)  . Hypothyroidism dx'd approx age 59  . Migraine syndrome   . Recurrent HSV (herpes simplex virus)   . Ulnar neuropathy of right upper extremity    x years, unknown etiology, big w/u was done but no site of entrapment was determined.   . Vitamin D deficiency     Past Surgical History:  Procedure Laterality Date  . NOSE SURGERY     rhinoplasty/septoplasty    Outpatient Medications Prior to Visit  Medication Sig Dispense Refill  . amoxicillin-clavulanate (AUGMENTIN) 875-125 MG tablet Take 1 tablet by mouth 2 (two) times daily.    . Cholecalciferol (VITAMIN D) 50 MCG (2000 UT) tablet Take 5000 IU daily.    . fluocinonide (LIDEX) 0.05 % external solution Apply topically.    Marland Kitchen ketoconazole (NIZORAL) 2 % shampoo Apply 1 application topically 2 (two) times a week.    . levothyroxine (SYNTHROID) 50 MCG tablet TAKE 1 TABLET BY MOUTH DAILY BEFORE BREAKFAST. 30 tablet 0  . Multiple Vitamin (MULTIVITAMIN) tablet Take by mouth.    . rosuvastatin (CRESTOR) 10 MG tablet TAKE 1 TABLET (10 MG TOTAL) BY MOUTH DAILY. 30 tablet 0   No facility-administered medications prior to visit.    No Known Allergies  ROS As per HPI  PE: Vitals with BMI 07/18/2019  Height 5\' 6"   Weight 165 lbs  BMI 26.64  Systolic 122  Diastolic 81  Pulse 78     ***  LABS:  Lab Results  Component Value Date   TSH 4.47 06/28/2019   Lab Results  Component Value Date   WBC 8.3 06/28/2019   HGB 15.7 06/28/2019   HCT 47 06/28/2019   PLT 283 06/28/2019   Lab Results   Component Value Date   CREATININE 1.0 06/28/2019   BUN 18 06/28/2019   NA 139 06/28/2019   K 4.4 06/28/2019   Lab Results  Component Value Date   ALT 22 06/28/2019   AST 19 06/28/2019   ALKPHOS 75 06/28/2019   Lab Results  Component Value Date   CHOL 215 (A) 06/28/2019   Lab Results  Component Value Date   HDL 66 06/28/2019   Lab Results  Component Value Date   LDLCALC 125 06/28/2019   Lab Results  Component Value Date   TRIG 122 06/28/2019     IMPRESSION AND PLAN:  No problem-specific Assessment & Plan notes found for this encounter.   An After Visit Summary was printed and given to the patient.  FOLLOW UP: No follow-ups on file.  Signed:  06/30/2019, MD           09/28/2020

## 2020-09-29 ENCOUNTER — Telehealth: Payer: Self-pay | Admitting: Family Medicine

## 2020-09-29 NOTE — Telephone Encounter (Signed)
30 day supply of both medications sent to [facility] long outpatient pharmacy. Patient made aware refills available and will contact pharmacy.

## 2020-09-29 NOTE — Telephone Encounter (Signed)
Patient needs refill of synthroid and crestor. He is out of both medications as of 09/30/20. He has an appt scheduled for 10/13/20 (first available with provider).

## 2020-10-02 ENCOUNTER — Encounter: Payer: Self-pay | Admitting: Family Medicine

## 2020-10-13 ENCOUNTER — Ambulatory Visit: Payer: 59 | Admitting: Family Medicine

## 2020-10-13 DIAGNOSIS — Z0289 Encounter for other administrative examinations: Secondary | ICD-10-CM

## 2020-10-13 NOTE — Progress Notes (Deleted)
OFFICE VISIT  10/13/2020  CC: No chief complaint on file.   HPI:    Patient is a 44 y.o. male who presents for f/u HLD and hypothyroidism. Has been on brand name synthroid x a decade or so. Also crestor 10mg  qd long term has been effective for lowering cholesterol and he has tolerated this well.  ***   Past Medical History:  Diagnosis Date  . Allergic rhinitis   . History of kidney stones   . Hyperlipemia, mixed    dx'd approx age 26 (+FH)  . Hypothyroidism dx'd approx age 89  . Migraine syndrome   . Recurrent HSV (herpes simplex virus)   . Ulnar neuropathy of right upper extremity    x years, unknown etiology, big w/u was done but no site of entrapment was determined.   . Vitamin D deficiency     Past Surgical History:  Procedure Laterality Date  . NOSE SURGERY     rhinoplasty/septoplasty    Outpatient Medications Prior to Visit  Medication Sig Dispense Refill  . amoxicillin-clavulanate (AUGMENTIN) 875-125 MG tablet Take 1 tablet by mouth 2 (two) times daily.    . Cholecalciferol (VITAMIN D) 50 MCG (2000 UT) tablet Take 5000 IU daily.    . fluocinonide (LIDEX) 0.05 % external solution Apply topically.    41 ketoconazole (NIZORAL) 2 % shampoo Apply 1 application topically 2 (two) times a week.    . levothyroxine (SYNTHROID) 50 MCG tablet TAKE 1 TABLET BY MOUTH DAILY BEFORE BREAKFAST. 30 tablet 0  . Multiple Vitamin (MULTIVITAMIN) tablet Take by mouth.    . rosuvastatin (CRESTOR) 10 MG tablet TAKE 1 TABLET (10 MG TOTAL) BY MOUTH DAILY. 30 tablet 0   No facility-administered medications prior to visit.    No Known Allergies  ROS As per HPI  PE: Vitals with BMI 07/18/2019  Height 5\' 6"   Weight 165 lbs  BMI 26.64  Systolic 122  Diastolic 81  Pulse 78     ***  LABS:  Lab Results  Component Value Date   TSH 4.47 06/28/2019   Lab Results  Component Value Date   WBC 8.3 06/28/2019   HGB 15.7 06/28/2019   HCT 47 06/28/2019   PLT 283 06/28/2019   Lab  Results  Component Value Date   CREATININE 1.0 06/28/2019   BUN 18 06/28/2019   NA 139 06/28/2019   K 4.4 06/28/2019   Lab Results  Component Value Date   ALT 22 06/28/2019   AST 19 06/28/2019   ALKPHOS 75 06/28/2019   Lab Results  Component Value Date   CHOL 215 (A) 06/28/2019   Lab Results  Component Value Date   HDL 66 06/28/2019   Lab Results  Component Value Date   LDLCALC 125 06/28/2019   Lab Results  Component Value Date   TRIG 122 06/28/2019   IMPRESSION AND PLAN:  No problem-specific Assessment & Plan notes found for this encounter.   An After Visit Summary was printed and given to the patient.  FOLLOW UP: No follow-ups on file.  Signed:  06/30/2019, MD           10/13/2020

## 2020-10-29 ENCOUNTER — Other Ambulatory Visit: Payer: Self-pay | Admitting: Family Medicine

## 2020-10-29 MED FILL — LEVOTHYROXINE 50 MCG TABLET: 50 | 30 days supply | Qty: 30 | Fill #0

## 2020-12-01 ENCOUNTER — Encounter: Payer: Self-pay | Admitting: Family Medicine

## 2020-12-01 ENCOUNTER — Other Ambulatory Visit: Payer: Self-pay

## 2020-12-01 ENCOUNTER — Ambulatory Visit (INDEPENDENT_AMBULATORY_CARE_PROVIDER_SITE_OTHER): Payer: BC Managed Care – PPO | Admitting: Family Medicine

## 2020-12-01 ENCOUNTER — Other Ambulatory Visit: Payer: Self-pay | Admitting: Family Medicine

## 2020-12-01 VITALS — BP 125/80 | HR 74 | Temp 97.9°F | Resp 16 | Ht 66.0 in | Wt 176.0 lb

## 2020-12-01 DIAGNOSIS — E7800 Pure hypercholesterolemia, unspecified: Secondary | ICD-10-CM | POA: Diagnosis not present

## 2020-12-01 DIAGNOSIS — E039 Hypothyroidism, unspecified: Secondary | ICD-10-CM

## 2020-12-01 LAB — COMPREHENSIVE METABOLIC PANEL
ALT: 44 U/L (ref 0–53)
AST: 25 U/L (ref 0–37)
Albumin: 4.7 g/dL (ref 3.5–5.2)
Alkaline Phosphatase: 61 U/L (ref 39–117)
BUN: 15 mg/dL (ref 6–23)
CO2: 30 mEq/L (ref 19–32)
Calcium: 9.8 mg/dL (ref 8.4–10.5)
Chloride: 103 mEq/L (ref 96–112)
Creatinine, Ser: 1.07 mg/dL (ref 0.40–1.50)
GFR: 85.74 mL/min (ref >60.00)
Glucose, Bld: 101 mg/dL — ABNORMAL HIGH (ref 70–99)
Potassium: 3.8 mEq/L (ref 3.5–5.1)
Sodium: 139 mEq/L (ref 135–145)
Total Bilirubin: 0.6 mg/dL (ref 0.2–1.2)
Total Protein: 7.3 g/dL (ref 6.0–8.3)

## 2020-12-01 LAB — LIPID PANEL
Cholesterol: 219 mg/dL — ABNORMAL HIGH (ref 0–200)
HDL: 66.5 mg/dL (ref >39.00)
NonHDL: 152.8
Total CHOL/HDL Ratio: 3
Triglycerides: 218 mg/dL — ABNORMAL HIGH (ref 0.0–149.0)
VLDL: 43.6 mg/dL — ABNORMAL HIGH (ref 0.0–40.0)

## 2020-12-01 LAB — LDL CHOLESTEROL, DIRECT: Direct LDL: 127 mg/dL

## 2020-12-01 LAB — TSH: TSH: 3 u[IU]/mL (ref 0.35–4.50)

## 2020-12-01 MED ORDER — ROSUVASTATIN CALCIUM 10 MG PO TABS: 10.0000 mg | ORAL_TABLET | Freq: Every day | ORAL | 3 refills | Status: DC

## 2020-12-01 MED ORDER — LEVOTHYROXINE SODIUM 50 MCG PO TABS: ORAL_TABLET | ORAL | 3 refills | Status: DC

## 2020-12-01 MED FILL — ROSUVASTATIN CALCIUM 10 MG: 10 | 30 days supply | Qty: 30 | Fill #0

## 2020-12-01 MED FILL — LEVOTHYROXINE 50 MCG TABLET: 50 | 30 days supply | Qty: 30 | Fill #0

## 2020-12-01 NOTE — Progress Notes (Signed)
OFFICE VISIT  12/01/2020  CC:  Chief Complaint  Patient presents with  . Follow-up    RCI, pt is fasting    HPI:    Patient is a 44 y.o. male who presents for f/u hypothyroidism and hypercholesterolemia. I last saw him for his establish care visit about 16 months ago. A/P as of that visit: "1) Hypothyroidism: well controlled. RF'd T4, 88 mcg qd, #30, RF x 11.  2) Hyperlipidemia, mixed: most recent FLP fine. Pt will be getting back into better dietary and exercise habits and says historically 10mg  qd crestor dosing has been the best treatment for him.  3) Right ulnar neuropathy: having chronic sx's at the level of the hand. Upper arm and forearm/wrist sx's have gradually resolved per his report. He notes a long/expensive w/u was done and no site of ulnar nerve entrapment was identified. As per his request today I referred him to PT at the [facility] rehab location at [facility] with [facility] for consideration of dry needling."  INTERIM HX: Feeling fine.  Hypothyroid: Hypoth: Takes T4 on empty stomach w/out any other meds.  HLD: compliant with rosuva 10 qd.  Active but no formal exercise. Pretty healthy diet.     Past Medical History:  Diagnosis Date  . Allergic rhinitis   . History of kidney stones   . Hyperlipemia, mixed    dx'd approx age 35 (+FH)  . Hypothyroidism dx'd approx age 18  . Migraine syndrome   . Recurrent HSV (herpes simplex virus)   . Ulnar neuropathy of right upper extremity    x years, unknown etiology, big w/u was done but no site of entrapment was determined.   . Vitamin D deficiency     Past Surgical History:  Procedure Laterality Date  . NOSE SURGERY     rhinoplasty/septoplasty    Outpatient Medications Prior to Visit  Medication Sig Dispense Refill  . Cholecalciferol (VITAMIN D) 50 MCG (2000 UT) tablet Take 5000 IU daily.    . Multiple Vitamin (MULTIVITAMIN) tablet Take by mouth.    . levothyroxine (SYNTHROID) 50 MCG tablet TAKE  1 TABLET BY MOUTH DAILY BEFORE BREAKFAST. 30 tablet 0  . rosuvastatin (CRESTOR) 10 MG tablet TAKE 1 TABLET (10 MG TOTAL) BY MOUTH DAILY. 30 tablet 0  . amoxicillin-clavulanate (AUGMENTIN) 875-125 MG tablet Take 1 tablet by mouth 2 (two) times daily. (Patient not taking: Reported on 12/01/2020)    . fluocinonide (LIDEX) 0.05 % external solution Apply topically. (Patient not taking: Reported on 12/01/2020)    . ketoconazole (NIZORAL) 2 % shampoo Apply 1 application topically 2 (two) times a week. (Patient not taking: Reported on 12/01/2020)     No facility-administered medications prior to visit.    No Known Allergies  ROS As per HPI  PE: Vitals with BMI 12/01/2020 07/18/2019  Height 5\' 6"  5\' 6"   Weight 176 lbs 165 lbs  BMI 28.42 26.64  Systolic 125 122  Diastolic 80 81  Pulse 74 78    Gen: Alert, well appearing.  Patient is oriented to person, place, time, and situation. AFFECT: pleasant, lucid thought and speech. 09/17/2019: no injection, icteris, swelling, or exudate.  EOMI, PERRLA. Mouth: lips without lesion/swelling.   Neck: no LAD, thyromegaly, or mass. CV: RRR, no m/r/g.   LUNGS: CTA bilat, nonlabored resps, good aeration in all lung fields. EXT: no clubbing or cyanosis.  no edema.    LABS:  Lab Results  Component Value Date   TSH 4.47 06/28/2019   Lab Results  Component Value Date   WBC 8.3 06/28/2019   HGB 15.7 06/28/2019   HCT 47 06/28/2019   PLT 283 06/28/2019   Lab Results  Component Value Date   CREATININE 1.0 06/28/2019   BUN 18 06/28/2019   NA 139 06/28/2019   K 4.4 06/28/2019   Lab Results  Component Value Date   ALT 22 06/28/2019   AST 19 06/28/2019   ALKPHOS 75 06/28/2019   Lab Results  Component Value Date   CHOL 215 (A) 06/28/2019   Lab Results  Component Value Date   HDL 66 06/28/2019   Lab Results  Component Value Date   LDLCALC 125 06/28/2019   Lab Results  Component Value Date   TRIG 122 06/28/2019    IMPRESSION AND  PLAN:  1) HLD: tolerating statin, diet pretty good, admits he needs to improve CV exercise. FLP and CMET today.  2) Hypothyroidism: feeling well on 50 mcg levothyroxine qd, compliant. TSH today.  New rx's for his rosuva 10mg  and levothyroxine 50 mcg sent in today.  An After Visit Summary was printed and given to the patient.  FOLLOW UP: Return in about 1 year (around 12/01/2021) for annual CPE (fasting).  Signed:  12/03/2021, MD           12/01/2020

## 2021-01-13 MED FILL — LEVOTHYROXINE 50 MCG TABLET: 50 | 30 days supply | Qty: 30 | Fill #1

## 2021-01-13 MED FILL — ROSUVASTATIN CALCIUM 10 MG: 10 | 30 days supply | Qty: 30 | Fill #1

## 2021-02-03 ENCOUNTER — Other Ambulatory Visit (HOSPITAL_COMMUNITY): Payer: Self-pay | Admitting: Gastroenterology

## 2021-02-03 DIAGNOSIS — R197 Diarrhea, unspecified: Secondary | ICD-10-CM | POA: Diagnosis not present

## 2021-02-03 DIAGNOSIS — R1030 Lower abdominal pain, unspecified: Secondary | ICD-10-CM | POA: Diagnosis not present

## 2021-02-03 MED FILL — DICYCLOMINE 10 MG CAPSULE: 10 | 30 days supply | Qty: 120 | Fill #0

## 2021-02-21 MED FILL — ROSUVASTATIN CALCIUM 10 MG: 10 | 90 days supply | Qty: 90 | Fill #2

## 2021-02-21 MED FILL — LEVOTHYROXINE 50 MCG TABLET: 50 | 90 days supply | Qty: 90 | Fill #2

## 2021-02-22 ENCOUNTER — Other Ambulatory Visit (HOSPITAL_COMMUNITY): Payer: Self-pay | Admitting: Pharmacist

## 2021-05-25 ENCOUNTER — Other Ambulatory Visit (HOSPITAL_COMMUNITY): Payer: Self-pay

## 2021-05-25 MED FILL — Levothyroxine Sodium Tab 50 MCG: ORAL | 90 days supply | Qty: 90 | Fill #0 | Status: AC

## 2021-05-25 MED FILL — Rosuvastatin Calcium Tab 10 MG: ORAL | 90 days supply | Qty: 90 | Fill #0 | Status: AC

## 2021-08-06 DIAGNOSIS — L738 Other specified follicular disorders: Secondary | ICD-10-CM | POA: Diagnosis not present

## 2021-08-06 DIAGNOSIS — D485 Neoplasm of uncertain behavior of skin: Secondary | ICD-10-CM | POA: Diagnosis not present

## 2021-08-06 DIAGNOSIS — L814 Other melanin hyperpigmentation: Secondary | ICD-10-CM | POA: Diagnosis not present

## 2021-08-06 DIAGNOSIS — D225 Melanocytic nevi of trunk: Secondary | ICD-10-CM | POA: Diagnosis not present

## 2021-08-06 DIAGNOSIS — D2239 Melanocytic nevi of other parts of face: Secondary | ICD-10-CM | POA: Diagnosis not present

## 2021-08-06 DIAGNOSIS — L821 Other seborrheic keratosis: Secondary | ICD-10-CM | POA: Diagnosis not present

## 2021-08-11 ENCOUNTER — Other Ambulatory Visit (HOSPITAL_COMMUNITY): Payer: Self-pay

## 2021-08-11 MED ORDER — KETOCONAZOLE 2 % EX SHAM
MEDICATED_SHAMPOO | CUTANEOUS | 3 refills | Status: DC
  Filled 2021-08-11: qty 120, 30d supply, fill #0

## 2021-08-11 MED ORDER — TRIAMCINOLONE ACETONIDE 0.1 % EX CREA
TOPICAL_CREAM | CUTANEOUS | 1 refills | Status: DC
  Filled 2021-08-11: qty 30, 15d supply, fill #0

## 2021-08-30 MED FILL — Rosuvastatin Calcium Tab 10 MG: ORAL | 90 days supply | Qty: 90 | Fill #1 | Status: AC

## 2021-08-30 MED FILL — Levothyroxine Sodium Tab 50 MCG: ORAL | 90 days supply | Qty: 90 | Fill #1 | Status: AC

## 2021-08-31 ENCOUNTER — Other Ambulatory Visit (HOSPITAL_COMMUNITY): Payer: Self-pay

## 2021-11-26 ENCOUNTER — Other Ambulatory Visit (HOSPITAL_COMMUNITY): Payer: Self-pay

## 2021-11-26 MED ORDER — CARESTART COVID-19 HOME TEST VI KIT
PACK | 0 refills | Status: DC
  Filled 2021-11-26: qty 4, 4d supply, fill #0

## 2021-11-26 MED FILL — Levothyroxine Sodium Tab 50 MCG: ORAL | 30 days supply | Qty: 30 | Fill #2 | Status: AC

## 2021-11-26 MED FILL — Rosuvastatin Calcium Tab 10 MG: ORAL | 30 days supply | Qty: 30 | Fill #2 | Status: AC

## 2021-12-02 ENCOUNTER — Encounter: Payer: BC Managed Care – PPO | Admitting: Family Medicine

## 2022-01-04 ENCOUNTER — Other Ambulatory Visit: Payer: Self-pay

## 2022-01-05 ENCOUNTER — Other Ambulatory Visit: Payer: Self-pay

## 2022-01-05 ENCOUNTER — Ambulatory Visit (INDEPENDENT_AMBULATORY_CARE_PROVIDER_SITE_OTHER): Payer: BC Managed Care – PPO | Admitting: Family Medicine

## 2022-01-05 ENCOUNTER — Other Ambulatory Visit (HOSPITAL_COMMUNITY): Payer: Self-pay

## 2022-01-05 ENCOUNTER — Encounter: Payer: Self-pay | Admitting: Family Medicine

## 2022-01-05 VITALS — BP 132/89 | HR 83 | Temp 97.9°F | Ht 66.0 in | Wt 179.2 lb

## 2022-01-05 DIAGNOSIS — E7800 Pure hypercholesterolemia, unspecified: Secondary | ICD-10-CM

## 2022-01-05 DIAGNOSIS — Z0000 Encounter for general adult medical examination without abnormal findings: Secondary | ICD-10-CM

## 2022-01-05 DIAGNOSIS — E039 Hypothyroidism, unspecified: Secondary | ICD-10-CM

## 2022-01-05 LAB — CBC WITH DIFFERENTIAL/PLATELET
Basophils Absolute: 0 K/uL (ref 0.0–0.1)
Basophils Relative: 0.3 % (ref 0.0–3.0)
Eosinophils Absolute: 0.2 K/uL (ref 0.0–0.7)
Eosinophils Relative: 3 % (ref 0.0–5.0)
HCT: 41.8 % (ref 39.0–52.0)
Hemoglobin: 14.2 g/dL (ref 13.0–17.0)
Lymphocytes Relative: 29.2 % (ref 12.0–46.0)
Lymphs Abs: 2 K/uL (ref 0.7–4.0)
MCHC: 34 g/dL (ref 30.0–36.0)
MCV: 85.9 fl (ref 78.0–100.0)
Monocytes Absolute: 0.7 K/uL (ref 0.1–1.0)
Monocytes Relative: 9.5 % (ref 3.0–12.0)
Neutro Abs: 4 K/uL (ref 1.4–7.7)
Neutrophils Relative %: 58 % (ref 43.0–77.0)
Platelets: 255 K/uL (ref 150.0–400.0)
RBC: 4.87 Mil/uL (ref 4.22–5.81)
RDW: 13.1 % (ref 11.5–15.5)
WBC: 7 K/uL (ref 4.0–10.5)

## 2022-01-05 LAB — COMPREHENSIVE METABOLIC PANEL
ALT: 32 U/L (ref 0–53)
AST: 23 U/L (ref 0–37)
Albumin: 4.6 g/dL (ref 3.5–5.2)
Alkaline Phosphatase: 61 U/L (ref 39–117)
BUN: 14 mg/dL (ref 6–23)
CO2: 29 mEq/L (ref 19–32)
Calcium: 9.8 mg/dL (ref 8.4–10.5)
Chloride: 100 mEq/L (ref 96–112)
Creatinine, Ser: 1.06 mg/dL (ref 0.40–1.50)
GFR: 86.05 mL/min (ref >60.00)
Glucose, Bld: 87 mg/dL (ref 70–99)
Potassium: 3.9 mEq/L (ref 3.5–5.1)
Sodium: 137 mEq/L (ref 135–145)
Total Bilirubin: 0.7 mg/dL (ref 0.2–1.2)
Total Protein: 7.2 g/dL (ref 6.0–8.3)

## 2022-01-05 LAB — LDL CHOLESTEROL, DIRECT: Direct LDL: 98 mg/dL

## 2022-01-05 LAB — LIPID PANEL
Cholesterol: 180 mg/dL (ref 0–200)
HDL: 55.1 mg/dL (ref >39.00)
NonHDL: 124.78
Total CHOL/HDL Ratio: 3
Triglycerides: 243 mg/dL — ABNORMAL HIGH (ref 0.0–149.0)
VLDL: 48.6 mg/dL — ABNORMAL HIGH (ref 0.0–40.0)

## 2022-01-05 LAB — TSH: TSH: 3.83 u[IU]/mL (ref 0.35–5.50)

## 2022-01-05 MED ORDER — ROSUVASTATIN CALCIUM 10 MG PO TABS
ORAL_TABLET | Freq: Every day | ORAL | 3 refills | Status: DC
  Filled 2022-01-05: qty 90, 90d supply, fill #0
  Filled 2022-05-08: qty 90, 90d supply, fill #1
  Filled 2022-09-17: qty 90, 90d supply, fill #2

## 2022-01-05 MED ORDER — LEVOTHYROXINE SODIUM 50 MCG PO TABS
ORAL_TABLET | Freq: Every day | ORAL | 3 refills | Status: DC
Start: 2022-01-05 — End: 2023-01-25
  Filled 2022-01-05: qty 90, 90d supply, fill #0
  Filled 2022-05-08: qty 90, 90d supply, fill #1
  Filled 2022-09-17: qty 90, 90d supply, fill #2

## 2022-01-05 NOTE — Patient Instructions (Signed)

## 2022-01-05 NOTE — Progress Notes (Signed)
Office Note 01/05/2022  CC:  Chief Complaint  Patient presents with   Follow-up    RCI; fasting    HPI:  Patient is a 46 y.o. male who is here for annual health maintenance exam and 1 yr f/u hypercholesterolemia and hypothyroidism. A/P as of last visit: "1) HLD: tolerating statin, diet pretty good, admits he needs to improve CV exercise. FLP and CMET today.   2) Hypothyroidism: feeling well on 50 mcg levothyroxine qd, compliant. TSH today."  INTERIM HX: He is feeling well. Typically exercises and diets very well--not so much over the last month or 2 with the holidays. No problems taking his levothyroxine or Crestor.  Past Medical History:  Diagnosis Date   Allergic rhinitis    History of kidney stones    Hyperlipemia, mixed    dx'd approx age 58 (+FH)   Hypothyroidism dx'd approx age 5   Migraine syndrome    Recurrent HSV (herpes simplex virus)    Ulnar neuropathy of right upper extremity    x years, unknown etiology, big w/u was done but no site of entrapment was determined.    Vitamin D deficiency     Past Surgical History:  Procedure Laterality Date   COLONOSCOPY  2012   Done for weight loss.  Normal.  Weight loss turned out to be secondary to severe stress   NOSE SURGERY     rhinoplasty/septoplasty    Family History  Problem Relation Age of Onset   Depression Mother    Drug abuse Mother    Mental illness Mother    High Cholesterol Father    High blood pressure Father    Alcohol abuse Maternal Grandfather    Heart attack Paternal Grandmother    Heart disease Paternal Grandmother    Pancreatic cancer Paternal Grandfather     Social History   Socioeconomic History   Marital status: Married    Spouse name: Not on file   Number of children: Not on file   Years of education: Not on file   Highest education level: Master's degree (e.g., MA, MS, MEng, MEd, MSW, MBA)  Occupational History   Not on file  Tobacco Use   Smoking status: Never    Smokeless tobacco: Never  Substance and Sexual Activity   Alcohol use: Yes    Alcohol/week: 2.0 - 3.0 standard drinks    Types: 2 - 3 Glasses of wine per week    Comment: occasionally   Drug use: Never   Sexual activity: Yes  Other Topics Concern   Not on file  Social History Narrative   Married, 1 daughter (3 y/o as of 07/2019).   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   His wife is a PA in [place].   Orig from [place].   No tob.   Alc: occ.   Social Determinants of Health   Financial Resource Strain: Low Risk    Difficulty of Paying Living Expenses: Not hard at all  Food Insecurity: No Food Insecurity   Worried About Charity fundraiser in the Last Year: Never true   [place] in the Last Year: Never true  Transportation Needs: No Transportation Needs   Lack of Transportation (Medical): No   Lack of Transportation (Non-Medical): No  Physical Activity: Insufficiently Active   Days of Exercise per Week: 3 days   Minutes of Exercise per Session: 30 min  Stress: No Stress Concern Present  Feeling of Stress : Only a little  Social Connections: Moderately Integrated   Frequency of Communication with Friends and Family: More than three times a week   Frequency of Social Gatherings with Friends and Family: Once a week   Attends Religious Services: More than 4 times per year   Active Member of [employer] or Organizations: No   Attends Music therapist: Not on file   Marital Status: Married  Human resources officer Violence: Not on file    Outpatient Medications Prior to Visit  Medication Sig Dispense Refill   Cholecalciferol (VITAMIN D) 50 MCG (2000 UT) tablet Take 5000 IU daily.     dicyclomine (BENTYL) 10 MG capsule TAKE 1 CAPSULE BY MOUTH 30 MINUTES BEFORE MEALS AND AT BEDTIME 120 capsule 5   ketoconazole (NIZORAL) 2 % shampoo Apply 2-3 times a week lather on scalp, leave on 8-10 minutes, rinse well.  120 mL 3   Multiple Vitamin (MULTIVITAMIN) tablet Take by mouth.     COVID-19 At Home Antigen Test [facility] COVID-19 HOME TEST) KIT Use as directed within package instructions 4 each 0   COVID-19 At Home Antigen Test KIT USE AS DIRECTED 4 kit 0   dicyclomine (BENTYL) 10 MG capsule Take by mouth.     levothyroxine (SYNTHROID) 50 MCG tablet TAKE 1 TABLET BY MOUTH DAILY BEFORE BREAKFAST. 90 tablet 3   rosuvastatin (CRESTOR) 10 MG tablet TAKE 1 TABLET BY MOUTH DAILY. 90 tablet 3   No facility-administered medications prior to visit.    No Known Allergies  ROS Review of Systems  Constitutional:  Negative for appetite change, chills, fatigue and fever.  HENT:  Negative for congestion, dental problem, ear pain and sore throat.   Eyes:  Negative for discharge, redness and visual disturbance.  Respiratory:  Negative for cough, chest tightness, shortness of breath and wheezing.   Cardiovascular:  Negative for chest pain, palpitations and leg swelling.  Gastrointestinal:  Negative for abdominal pain, blood in stool, diarrhea, nausea and vomiting.  Genitourinary:  Negative for difficulty urinating, dysuria, flank pain, frequency, hematuria and urgency.  Musculoskeletal:  Negative for arthralgias, back pain, joint swelling, myalgias and neck stiffness.  Skin:  Negative for pallor and rash.  Neurological:  Negative for dizziness, speech difficulty, weakness and headaches.  Hematological:  Negative for adenopathy. Does not bruise/bleed easily.  Psychiatric/Behavioral:  Negative for confusion and sleep disturbance. The patient is not nervous/anxious.    PE; Vitals with BMI 01/05/2022 12/01/2020 07/18/2019  Height _0  _1  _2   Weight 179 lbs 3 oz 176 lbs 165 lbs  BMI 28.94 97.98 92.11  Systolic 941 740 814  Diastolic 89 80 81  Pulse 83 74 78   Gen: Alert, well appearing.  Patient is oriented to person, place, time, and situation. AFFECT: pleasant, lucid thought and speech. ENT: Ears: EACs  clear, normal epithelium.  TMs with good light reflex and landmarks bilaterally.  Eyes: no injection, icteris, swelling, or exudate.  EOMI, PERRLA. Nose: no drainage or turbinate edema/swelling.  No injection or focal lesion.  Mouth: lips without lesion/swelling.  Oral mucosa pink and moist.  Dentition intact and without obvious caries or gingival swelling.  Oropharynx without erythema, exudate, or swelling.  Neck: supple/nontender.  No LAD, mass, or TM.  Carotid pulses 2+ bilaterally, without bruits. CV: RRR, no m/r/g.   LUNGS: CTA bilat, nonlabored resps, good aeration in all lung fields. ABD: soft, NT, ND, BS normal.  No hepatospenomegaly or mass.  No bruits. EXT: no  clubbing, cyanosis, or edema.  Musculoskeletal: no joint swelling, erythema, warmth, or tenderness.  ROM of all joints intact. Skin - no sores or suspicious lesions or rashes or color changes  Pertinent labs:  Lab Results  Component Value Date   TSH 3.00 12/01/2020   Lab Results  Component Value Date   WBC 8.3 06/28/2019   HGB 15.7 06/28/2019   HCT 47 06/28/2019   PLT 283 06/28/2019   Lab Results  Component Value Date   CREATININE 1.07 12/01/2020   BUN 15 12/01/2020   NA 139 12/01/2020   K 3.8 12/01/2020   CL 103 12/01/2020   CO2 30 12/01/2020   Lab Results  Component Value Date   ALT 44 12/01/2020   AST 25 12/01/2020   ALKPHOS 61 12/01/2020   BILITOT 0.6 12/01/2020   Lab Results  Component Value Date   CHOL 219 (H) 12/01/2020   Lab Results  Component Value Date   HDL 66.50 12/01/2020   Lab Results  Component Value Date   LDLCALC 125 06/28/2019   Lab Results  Component Value Date   TRIG 218.0 (H) 12/01/2020   Lab Results  Component Value Date   CHOLHDL 3 12/01/2020    ASSESSMENT AND PLAN:   #1 hypothyroidism.  Historically very stable on levothyroxine 50 mcg daily. TSH checked today.  #2 hypercholesterolemia--stable on rosuvastatin 10 mg a day. Fasting lipids and complete metabolic  panel today.  3.  Health maintenance exam: Reviewed age and gender appropriate health maintenance issues (prudent diet, regular exercise, health risks of tobacco and excessive alcohol, use of seatbelts, fire alarms in home, use of sunscreen).  Also reviewed age and gender appropriate health screening as well as vaccine recommendations. Vaccines: Flu->UTD.  Otherwise ALL UTD. Labs: HP labs ordered today.  He declined HIV and Hep C screening today. Prostate ca screening: average risk patient= as per latest guidelines, start screening at 95 yrs of age. Colon ca screening: average risk patient= as per latest guidelines, start screening at 50 yrs of age.  An After Visit Summary was printed and given to the patient.  FOLLOW UP:  Return in about 1 year (around 01/05/2023) for annual CPE (fasting).  Signed:  Crissie Sickles, MD           01/05/2022

## 2022-01-06 ENCOUNTER — Other Ambulatory Visit (HOSPITAL_COMMUNITY): Payer: Self-pay

## 2022-01-06 ENCOUNTER — Encounter: Payer: Self-pay | Admitting: Family Medicine

## 2022-01-06 NOTE — Telephone Encounter (Signed)
This is something he will need to have ordered by Dr. Amedeo Plenty.

## 2022-01-10 DIAGNOSIS — M25531 Pain in right wrist: Secondary | ICD-10-CM | POA: Diagnosis not present

## 2022-01-10 DIAGNOSIS — M79641 Pain in right hand: Secondary | ICD-10-CM | POA: Diagnosis not present

## 2022-01-10 DIAGNOSIS — G5621 Lesion of ulnar nerve, right upper limb: Secondary | ICD-10-CM | POA: Diagnosis not present

## 2022-01-19 DIAGNOSIS — M25531 Pain in right wrist: Secondary | ICD-10-CM | POA: Diagnosis not present

## 2022-01-26 DIAGNOSIS — M25531 Pain in right wrist: Secondary | ICD-10-CM | POA: Diagnosis not present

## 2022-01-26 DIAGNOSIS — G5621 Lesion of ulnar nerve, right upper limb: Secondary | ICD-10-CM | POA: Diagnosis not present

## 2022-02-25 ENCOUNTER — Ambulatory Visit: Payer: BC Managed Care – PPO | Admitting: Family Medicine

## 2022-05-10 ENCOUNTER — Other Ambulatory Visit (HOSPITAL_COMMUNITY): Payer: Self-pay

## 2022-09-26 ENCOUNTER — Other Ambulatory Visit (HOSPITAL_COMMUNITY): Payer: Self-pay

## 2022-09-27 ENCOUNTER — Other Ambulatory Visit (HOSPITAL_COMMUNITY): Payer: Self-pay

## 2023-01-02 NOTE — Patient Instructions (Signed)
Health Maintenance, Male Adopting a healthy lifestyle and getting preventive care are important in promoting health and wellness. Ask your health care provider about: The right schedule for you to have regular tests and exams. Things you can do on your own to prevent diseases and keep yourself healthy. What should I know about diet, weight, and exercise? Eat a healthy diet  Eat a diet that includes plenty of vegetables, fruits, low-fat dairy products, and lean protein. Do not eat a lot of foods that are high in solid fats, added sugars, or sodium. Maintain a healthy weight Body mass index (BMI) is a measurement that can be used to identify possible weight problems. It estimates body fat based on height and weight. Your health care provider can help determine your BMI and help you achieve or maintain a healthy weight. Get regular exercise Get regular exercise. This is one of the most important things you can do for your health. Most adults should: Exercise for at least 150 minutes each week. The exercise should increase your heart rate and make you sweat (moderate-intensity exercise). Do strengthening exercises at least twice a week. This is in addition to the moderate-intensity exercise. Spend less time sitting. Even light physical activity can be beneficial. Watch cholesterol and blood lipids Have your blood tested for lipids and cholesterol at 45 years of age, then have this test every 5 years. You may need to have your cholesterol levels checked more often if: Your lipid or cholesterol levels are high. You are older than 45 years of age. You are at high risk for heart disease. What should I know about cancer screening? Many types of cancers can be detected early and may often be prevented. Depending on your health history and family history, you may need to have cancer screening at various ages. This may include screening for: Colorectal cancer. Prostate cancer. Skin cancer. Lung  cancer. What should I know about heart disease, diabetes, and high blood pressure? Blood pressure and heart disease High blood pressure causes heart disease and increases the risk of stroke. This is more likely to develop in people who have high blood pressure readings or are overweight. Talk with your health care provider about your target blood pressure readings. Have your blood pressure checked: Every 3-5 years if you are 18-39 years of age. Every year if you are 40 years old or older. If you are between the ages of 65 and 75 and are a current or former smoker, ask your health care provider if you should have a one-time screening for abdominal aortic aneurysm (AAA). Diabetes Have regular diabetes screenings. This checks your fasting blood sugar level. Have the screening done: Once every three years after age 45 if you are at a normal weight and have a low risk for diabetes. More often and at a younger age if you are overweight or have a high risk for diabetes. What should I know about preventing infection? Hepatitis B If you have a higher risk for hepatitis B, you should be screened for this virus. Talk with your health care provider to find out if you are at risk for hepatitis B infection. Hepatitis C Blood testing is recommended for: Everyone born from 1945 through 1965. Anyone with known risk factors for hepatitis C. Sexually transmitted infections (STIs) You should be screened each year for STIs, including gonorrhea and chlamydia, if: You are sexually active and are younger than 45 years of age. You are older than 45 years of age and your   health care provider tells you that you are at risk for this type of infection. Your sexual activity has changed since you were last screened, and you are at increased risk for chlamydia or gonorrhea. Ask your health care provider if you are at risk. Ask your health care provider about whether you are at high risk for HIV. Your health care provider  may recommend a prescription medicine to help prevent HIV infection. If you choose to take medicine to prevent HIV, you should first get tested for HIV. You should then be tested every 3 months for as long as you are taking the medicine. Follow these instructions at home: Alcohol use Do not drink alcohol if your health care provider tells you not to drink. If you drink alcohol: Limit how much you have to 0-2 drinks a day. Know how much alcohol is in your drink. In the [place] one drink equals one 12 oz bottle of beer (355 mL), one 5 oz glass of wine (148 mL), or one 1 oz glass of hard liquor (44 mL). Lifestyle Do not use any products that contain nicotine or tobacco. These products include cigarettes, chewing tobacco, and vaping devices, such as e-cigarettes. If you need help quitting, ask your health care provider. Do not use street drugs. Do not share needles. Ask your health care provider for help if you need support or information about quitting drugs. General instructions Schedule regular health, dental, and eye exams. Stay current with your vaccines. Tell your health care provider if: You often feel depressed. You have ever been abused or do not feel safe at home. Summary Adopting a healthy lifestyle and getting preventive care are important in promoting health and wellness. Follow your health care provider's instructions about healthy diet, exercising, and getting tested or screened for diseases. Follow your health care provider's instructions on monitoring your cholesterol and blood pressure. This information is not intended to replace advice given to you by your health care provider. Make sure you discuss any questions you have with your health care provider. Document Revised: 04/19/2021 Document Reviewed: 04/19/2021 Elsevier Patient Education  2023 Elsevier Inc.  

## 2023-01-05 ENCOUNTER — Ambulatory Visit (INDEPENDENT_AMBULATORY_CARE_PROVIDER_SITE_OTHER): Payer: BC Managed Care – PPO | Admitting: Family Medicine

## 2023-01-05 DIAGNOSIS — Z0000 Encounter for general adult medical examination without abnormal findings: Secondary | ICD-10-CM

## 2023-01-05 DIAGNOSIS — E039 Hypothyroidism, unspecified: Secondary | ICD-10-CM

## 2023-01-05 DIAGNOSIS — E7800 Pure hypercholesterolemia, unspecified: Secondary | ICD-10-CM

## 2023-01-05 NOTE — Progress Notes (Signed)
Office Note 01/05/2023  CC: No chief complaint on file.   HPI:  Patient is a 47 y.o. male who is here for annual health maintenance exam and 1 year follow-up hypercholesterolemia and hypothyroidism.  Past Medical History:  Diagnosis Date   Allergic rhinitis    History of kidney stones    Hyperlipemia, mixed    dx'd approx age 28 (+FH)   Hypothyroidism dx'd approx age 36   Migraine syndrome    Recurrent HSV (herpes simplex virus)    Ulnar neuropathy of right upper extremity    x years, unknown etiology, big w/u was done but no site of entrapment was determined.    Vitamin D deficiency     Past Surgical History:  Procedure Laterality Date   COLONOSCOPY  2012   Done for weight loss.  Normal.  Weight loss turned out to be secondary to severe stress   NOSE SURGERY     rhinoplasty/septoplasty    Family History  Problem Relation Age of Onset   Depression Mother    Drug abuse Mother    Mental illness Mother    High Cholesterol Father    High blood pressure Father    Alcohol abuse Maternal Grandfather    Heart attack Paternal Grandmother    Heart disease Paternal Grandmother    Pancreatic cancer Paternal Grandfather     Social History   Socioeconomic History   Marital status: Married    Spouse name: Not on file   Number of children: Not on file   Years of education: Not on file   Highest education level: Master's degree (e.g., MA, MS, MEng, MEd, MSW, MBA)  Occupational History   Not on file  Tobacco Use   Smoking status: Never   Smokeless tobacco: Never  Substance and Sexual Activity   Alcohol use: Yes    Alcohol/week: 2.0 - 3.0 standard drinks of alcohol    Types: 2 - 3 Glasses of wine per week    Comment: occasionally   Drug use: Never   Sexual activity: Yes  Other Topics Concern   Not on file  Social History Narrative   Married, 1 daughter (61 y/o as of 07/2019).   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    His wife is a PA in [place] [employer].   Orig from [place].   No tob.   Alc: occ.   Social Determinants of Health   Financial Resource Strain: Low Risk  (01/04/2022)   Overall Financial Resource Strain (CARDIA)    Difficulty of Paying Living Expenses: Not hard at all  Food Insecurity: No Food Insecurity (01/04/2022)   Hunger Vital Sign    Worried About Running Out of Food in the Last Year: Never true    Ran Out of Food in the Last Year: Never true  Transportation Needs: No Transportation Needs (01/04/2022)   PRAPARE - Hydrologist (Medical): No    Lack of Transportation (Non-Medical): No  Physical Activity: Insufficiently Active (01/04/2022)   Exercise Vital Sign    Days of Exercise per Week: 3 days    Minutes of Exercise per Session: 30 min  Stress: No Stress Concern Present (01/04/2022)   [place]    Feeling of Stress : Only a little  Social Connections: Moderately Integrated (01/04/2022)   Social Connection and Isolation Panel [NHANES]    Frequency of Communication with Friends and Family:  More than three times a week    Frequency of Social Gatherings with Friends and Family: Once a week    Attends Religious Services: More than 4 times per year    Active Member of [employer] or Organizations: No    Attends Music therapist: Not on file    Marital Status: Married  Human resources officer Violence: Not on file    Outpatient Medications Prior to Visit  Medication Sig Dispense Refill   Cholecalciferol (VITAMIN D) 50 MCG (2000 UT) tablet Take 5000 IU daily.     dicyclomine (BENTYL) 10 MG capsule TAKE 1 CAPSULE BY MOUTH 30 MINUTES BEFORE MEALS AND AT BEDTIME 120 capsule 5   ketoconazole (NIZORAL) 2 % shampoo Apply 2-3 times a week lather on scalp, leave on 8-10 minutes, rinse well. 120 mL 3   levothyroxine (SYNTHROID) 50 MCG tablet TAKE 1 TABLET BY MOUTH DAILY BEFORE BREAKFAST. 90  tablet 3   Multiple Vitamin (MULTIVITAMIN) tablet Take by mouth.     rosuvastatin (CRESTOR) 10 MG tablet TAKE 1 TABLET BY MOUTH DAILY. 90 tablet 3   No facility-administered medications prior to visit.    No Known Allergies  Review of Systems *** PE;    01/05/2022   10:06 AM 12/01/2020    9:35 AM 07/18/2019   11:01 AM  Vitals with BMI  Height 5\' 6"  5\' 6"  5\' 6"   Weight 179 lbs 3 oz 176 lbs 165 lbs  BMI 28.94 03.47 42.59  Systolic 563 875 643  Diastolic 89 80 81  Pulse 83 74 78     *** Pertinent labs:  Lab Results  Component Value Date   TSH 3.83 01/05/2022   Lab Results  Component Value Date   WBC 7.0 01/05/2022   HGB 14.2 01/05/2022   HCT 41.8 01/05/2022   MCV 85.9 01/05/2022   PLT 255.0 01/05/2022   Lab Results  Component Value Date   CREATININE 1.06 01/05/2022   BUN 14 01/05/2022   NA 137 01/05/2022   K 3.9 01/05/2022   CL 100 01/05/2022   CO2 29 01/05/2022   Lab Results  Component Value Date   ALT 32 01/05/2022   AST 23 01/05/2022   ALKPHOS 61 01/05/2022   BILITOT 0.7 01/05/2022   Lab Results  Component Value Date   CHOL 180 01/05/2022   Lab Results  Component Value Date   HDL 55.10 01/05/2022   Lab Results  Component Value Date   LDLCALC 125 06/28/2019   Lab Results  Component Value Date   TRIG 243.0 (H) 01/05/2022   Lab Results  Component Value Date   CHOLHDL 3 01/05/2022    ASSESSMENT AND PLAN:   No problem-specific Assessment & Plan notes found for this encounter.  Health maintenance exam: Reviewed age and gender appropriate health maintenance issues (prudent diet, regular exercise, health risks of tobacco and excessive alcohol, use of seatbelts, fire alarms in home, use of sunscreen).  Also reviewed age and gender appropriate health screening as well as vaccine recommendations. Vaccines: ALL UTD. Labs: HP labs ordered today.   Prostate ca screening: average risk patient= as per latest guidelines, start screening at 35 yrs of  age. Colon ca screening: average risk patient= as per latest guidelines, start screening at 34 yrs of age.  An After Visit Summary was printed and given to the patient.  FOLLOW UP:  No follow-ups on file.  Signed:  Crissie Sickles, MD           01/05/2023

## 2023-01-12 ENCOUNTER — Encounter: Payer: Self-pay | Admitting: Family Medicine

## 2023-01-26 ENCOUNTER — Ambulatory Visit (INDEPENDENT_AMBULATORY_CARE_PROVIDER_SITE_OTHER): Payer: BC Managed Care – PPO | Admitting: Family Medicine

## 2023-01-26 ENCOUNTER — Other Ambulatory Visit (HOSPITAL_COMMUNITY): Payer: Self-pay

## 2023-01-26 VITALS — BP 132/88 | HR 83 | Temp 97.8°F | Ht 66.0 in | Wt 178.8 lb

## 2023-01-26 DIAGNOSIS — E7800 Pure hypercholesterolemia, unspecified: Secondary | ICD-10-CM | POA: Diagnosis not present

## 2023-01-26 DIAGNOSIS — Z0000 Encounter for general adult medical examination without abnormal findings: Secondary | ICD-10-CM | POA: Diagnosis not present

## 2023-01-26 DIAGNOSIS — K4090 Unilateral inguinal hernia, without obstruction or gangrene, not specified as recurrent: Secondary | ICD-10-CM

## 2023-01-26 DIAGNOSIS — E039 Hypothyroidism, unspecified: Secondary | ICD-10-CM

## 2023-01-26 MED ORDER — ROSUVASTATIN CALCIUM 10 MG PO TABS
10.0000 mg | ORAL_TABLET | Freq: Every day | ORAL | 3 refills | Status: DC
  Filled 2023-01-26: qty 90, 90d supply, fill #0
  Filled 2023-06-14: qty 90, 90d supply, fill #1
  Filled 2023-12-07: qty 90, 90d supply, fill #2

## 2023-01-26 MED ORDER — LEVOTHYROXINE SODIUM 50 MCG PO TABS
50.0000 ug | ORAL_TABLET | Freq: Every day | ORAL | 3 refills | Status: DC
  Filled 2023-01-26: qty 90, 90d supply, fill #0
  Filled 2023-06-14: qty 90, 90d supply, fill #1
  Filled 2023-12-07: qty 90, 90d supply, fill #2

## 2023-01-26 NOTE — Patient Instructions (Signed)
Health Maintenance, Male Adopting a healthy lifestyle and getting preventive care are important in promoting health and wellness. Ask your health care provider about: The right schedule for you to have regular tests and exams. Things you can do on your own to prevent diseases and keep yourself healthy. What should I know about diet, weight, and exercise? Eat a healthy diet  Eat a diet that includes plenty of vegetables, fruits, low-fat dairy products, and lean protein. Do not eat a lot of foods that are high in solid fats, added sugars, or sodium. Maintain a healthy weight Body mass index (BMI) is a measurement that can be used to identify possible weight problems. It estimates body fat based on height and weight. Your health care provider can help determine your BMI and help you achieve or maintain a healthy weight. Get regular exercise Get regular exercise. This is one of the most important things you can do for your health. Most adults should: Exercise for at least 150 minutes each week. The exercise should increase your heart rate and make you sweat (moderate-intensity exercise). Do strengthening exercises at least twice a week. This is in addition to the moderate-intensity exercise. Spend less time sitting. Even light physical activity can be beneficial. Watch cholesterol and blood lipids Have your blood tested for lipids and cholesterol at 45 years of age, then have this test every 5 years. You may need to have your cholesterol levels checked more often if: Your lipid or cholesterol levels are high. You are older than 45 years of age. You are at high risk for heart disease. What should I know about cancer screening? Many types of cancers can be detected early and may often be prevented. Depending on your health history and family history, you may need to have cancer screening at various ages. This may include screening for: Colorectal cancer. Prostate cancer. Skin cancer. Lung  cancer. What should I know about heart disease, diabetes, and high blood pressure? Blood pressure and heart disease High blood pressure causes heart disease and increases the risk of stroke. This is more likely to develop in people who have high blood pressure readings or are overweight. Talk with your health care provider about your target blood pressure readings. Have your blood pressure checked: Every 3-5 years if you are 18-39 years of age. Every year if you are 40 years old or older. If you are between the ages of 65 and 75 and are a current or former smoker, ask your health care provider if you should have a one-time screening for abdominal aortic aneurysm (AAA). Diabetes Have regular diabetes screenings. This checks your fasting blood sugar level. Have the screening done: Once every three years after age 45 if you are at a normal weight and have a low risk for diabetes. More often and at a younger age if you are overweight or have a high risk for diabetes. What should I know about preventing infection? Hepatitis B If you have a higher risk for hepatitis B, you should be screened for this virus. Talk with your health care provider to find out if you are at risk for hepatitis B infection. Hepatitis C Blood testing is recommended for: Everyone born from 1945 through 1965. Anyone with known risk factors for hepatitis C. Sexually transmitted infections (STIs) You should be screened each year for STIs, including gonorrhea and chlamydia, if: You are sexually active and are younger than 45 years of age. You are older than 45 years of age and your   health care provider tells you that you are at risk for this type of infection. Your sexual activity has changed since you were last screened, and you are at increased risk for chlamydia or gonorrhea. Ask your health care provider if you are at risk. Ask your health care provider about whether you are at high risk for HIV. Your health care provider  may recommend a prescription medicine to help prevent HIV infection. If you choose to take medicine to prevent HIV, you should first get tested for HIV. You should then be tested every 3 months for as long as you are taking the medicine. Follow these instructions at home: Alcohol use Do not drink alcohol if your health care provider tells you not to drink. If you drink alcohol: Limit how much you have to 0-2 drinks a day. Know how much alcohol is in your drink. In the [place] one drink equals one 12 oz bottle of beer (355 mL), one 5 oz glass of wine (148 mL), or one 1 oz glass of hard liquor (44 mL). Lifestyle Do not use any products that contain nicotine or tobacco. These products include cigarettes, chewing tobacco, and vaping devices, such as e-cigarettes. If you need help quitting, ask your health care provider. Do not use street drugs. Do not share needles. Ask your health care provider for help if you need support or information about quitting drugs. General instructions Schedule regular health, dental, and eye exams. Stay current with your vaccines. Tell your health care provider if: You often feel depressed. You have ever been abused or do not feel safe at home. Summary Adopting a healthy lifestyle and getting preventive care are important in promoting health and wellness. Follow your health care provider's instructions about healthy diet, exercising, and getting tested or screened for diseases. Follow your health care provider's instructions on monitoring your cholesterol and blood pressure. This information is not intended to replace advice given to you by your health care provider. Make sure you discuss any questions you have with your health care provider. Document Revised: 04/19/2021 Document Reviewed: 04/19/2021 Elsevier Patient Education  2023 Elsevier Inc.  

## 2023-01-26 NOTE — Progress Notes (Signed)
Office Note 01/26/2023  CC:  Chief Complaint  Joseph Stephens presents with   Medical Management of Chronic Issues   HPI:  Joseph Stephens is a 47 y.o. male who is here for annual health maintenance exam and 1 year follow-up hypercholesterolemia and hypothyroidism.  Doing great. Working out 4 days a week at [employer] heavy weightlifting. Eats a healthy diet. Work is going good.  He notes a small protrusion on left side of his groin when he strains.  No pain.  Past Medical History:  Diagnosis Date   Allergic rhinitis    History of kidney stones    Hyperlipemia, mixed    dx'd approx age 52 (+FH)   Hypothyroidism dx'd approx age 50   Migraine syndrome    Recurrent HSV (herpes simplex virus)    Ulnar neuropathy of right upper extremity    x years, unknown etiology, big w/u was done but no site of entrapment was determined.    Vitamin D deficiency     Past Surgical History:  Procedure Laterality Date   COLONOSCOPY  2012   Done for weight loss.  Normal.  Weight loss turned out to be secondary to severe stress   NOSE SURGERY     rhinoplasty/septoplasty    Family History  Problem Relation Age of Onset   Depression Mother    Drug abuse Mother    Mental illness Mother    High Cholesterol Father    High blood pressure Father    Alcohol abuse Maternal Grandfather    Heart attack Paternal Grandmother    Heart disease Paternal Grandmother    Pancreatic cancer Paternal Grandfather     Social History   Socioeconomic History   Marital status: Married    Spouse name: Not on file   Number of children: Not on file   Years of education: Not on file   Highest education level: Master's degree (e.g., MA, MS, MEng, MEd, MSW, MBA)  Occupational History   Not on file  Tobacco Use   Smoking status: Never   Smokeless tobacco: Never  Substance and Sexual Activity   Alcohol use: Yes    Alcohol/week: 2.0 - 3.0 standard drinks of alcohol    Types: 2 - 3 Glasses of wine per week     Comment: occasionally   Drug use: Never   Sexual activity: Yes  Other Topics Concern   Not on file  Social History Narrative   Married, 1 daughter (35 y/o as of 07/2019).   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   His wife is a PA in [facility].   Orig from [place].   No tob.   Alc: occ.   Social Determinants of Health   Financial Resource Strain: Low Risk  (01/26/2023)   Overall Financial Resource Strain (CARDIA)    Difficulty of Paying Living Expenses: Not hard at all  Food Insecurity: No Food Insecurity (01/26/2023)   Hunger Vital Sign    Worried About Running Out of Food in the Last Year: Never true    Ran Out of Food in the Last Year: Never true  Transportation Needs: No Transportation Needs (01/26/2023)   PRAPARE - Hydrologist (Medical): No    Lack of Transportation (Non-Medical): No  Physical Activity: Sufficiently Active (01/26/2023)   Exercise Vital Sign    Days of Exercise per Week: 4 days    Minutes of Exercise per Session: 60 min  Stress: No Stress  Concern Present (01/26/2023)   [place]    Feeling of Stress : Not at all  Social Connections: Moderately Integrated (01/26/2023)   Social Connection and Isolation Panel [NHANES]    Frequency of Communication with Friends and Family: More than three times a week    Frequency of Social Gatherings with Friends and Family: Twice a week    Attends Religious Services: More than 4 times per year    Active Member of [employer] or Organizations: No    Attends Music therapist: Not on file    Marital Status: Married  Human resources officer Violence: Not on file    Outpatient Medications Prior to Visit  Medication Sig Dispense Refill   Cholecalciferol (VITAMIN D) 50 MCG (2000 UT) tablet Take 5000 IU daily.     Multiple Vitamin (MULTIVITAMIN) tablet Take by mouth.      dicyclomine (BENTYL) 10 MG capsule TAKE 1 CAPSULE BY MOUTH 30 MINUTES BEFORE MEALS AND AT BEDTIME 120 capsule 5   ketoconazole (NIZORAL) 2 % shampoo Apply 2-3 times a week lather on scalp, leave on 8-10 minutes, rinse well. 120 mL 3   levothyroxine (SYNTHROID) 50 MCG tablet TAKE 1 TABLET BY MOUTH DAILY BEFORE BREAKFAST. 90 tablet 3   rosuvastatin (CRESTOR) 10 MG tablet TAKE 1 TABLET BY MOUTH DAILY. 90 tablet 3   No facility-administered medications prior to visit.    No Known Allergies  Review of Systems  Constitutional:  Negative for appetite change, chills, fatigue and fever.  HENT:  Negative for congestion, dental problem, ear pain and sore throat.   Eyes:  Negative for discharge, redness and visual disturbance.  Respiratory:  Negative for cough, chest tightness, shortness of breath and wheezing.   Cardiovascular:  Negative for chest pain, palpitations and leg swelling.  Gastrointestinal:  Negative for abdominal pain, blood in stool, diarrhea, nausea and vomiting.  Genitourinary:  Negative for difficulty urinating, dysuria, flank pain, frequency, hematuria and urgency.  Musculoskeletal:  Negative for arthralgias, back pain, joint swelling, myalgias and neck stiffness.  Skin:  Negative for pallor and rash.  Neurological:  Negative for dizziness, speech difficulty, weakness and headaches.  Hematological:  Negative for adenopathy. Does not bruise/bleed easily.  Psychiatric/Behavioral:  Negative for confusion and sleep disturbance. The Joseph Stephens is not nervous/anxious.    PE;    01/26/2023    2:27 PM 01/05/2022   10:06 AM 12/01/2020    9:35 AM  Vitals with BMI  Height 5' 6"$  5' 6"$  5' 6"$   Weight 178 lbs 13 oz 179 lbs 3 oz 176 lbs  BMI 28.87 [id number] [id number]  Systolic Q000111Q Q000111Q 0000000  Diastolic 88 89 80  Pulse 83 83 74    Gen: Alert, well appearing.  Joseph Stephens is oriented to person, place, time, and situation. AFFECT: pleasant, lucid thought and speech. ENT: Ears: EACs clear, normal epithelium.   TMs with good light reflex and landmarks bilaterally.  Eyes: no injection, icteris, swelling, or exudate.  EOMI, PERRLA. Nose: no drainage or turbinate edema/swelling.  No injection or focal lesion.  Mouth: lips without lesion/swelling.  Oral mucosa pink and moist.  Dentition intact and without obvious caries or gingival swelling.  Oropharynx without erythema, exudate, or swelling.  Neck: supple/nontender.  No LAD, mass, or TM.  Carotid pulses 2+ bilaterally, without bruits. CV: RRR, no m/r/g.   LUNGS: CTA bilat, nonlabored resps, good aeration in all lung fields. ABD: soft, NT, ND, BS normal.  No hepatospenomegaly or mass.  No bruits. GROIN: very small palpable, non-tender protrusion in L groin superior to inguinal ligament---only palpable when pt coughs.  Otherwise there is no visible or palpable hernia. Scrotum/testes normal. EXT: no clubbing, cyanosis, or edema.  Musculoskeletal: no joint swelling, erythema, warmth, or tenderness.  ROM of all joints intact. Skin - no sores or suspicious lesions or rashes or color changes  Pertinent labs:  Lab Results  Component Value Date   TSH 3.83 01/05/2022   Lab Results  Component Value Date   WBC 7.0 01/05/2022   HGB 14.2 01/05/2022   HCT 41.8 01/05/2022   MCV 85.9 01/05/2022   PLT 255.0 01/05/2022   Lab Results  Component Value Date   CREATININE 1.06 01/05/2022   BUN 14 01/05/2022   NA 137 01/05/2022   K 3.9 01/05/2022   CL 100 01/05/2022   CO2 29 01/05/2022   Lab Results  Component Value Date   ALT 32 01/05/2022   AST 23 01/05/2022   ALKPHOS 61 01/05/2022   BILITOT 0.7 01/05/2022   Lab Results  Component Value Date   CHOL 180 01/05/2022   Lab Results  Component Value Date   HDL 55.10 01/05/2022   Lab Results  Component Value Date   LDLCALC 125 06/28/2019   Lab Results  Component Value Date   TRIG 243.0 (H) 01/05/2022   Lab Results  Component Value Date   CHOLHDL 3 01/05/2022   ASSESSMENT AND PLAN:   #1 health  maintenance exam: Reviewed age and gender appropriate health maintenance issues (prudent diet, regular exercise, health risks of tobacco and excessive alcohol, use of seatbelts, fire alarms in home, use of sunscreen).  Also reviewed age and gender appropriate health screening as well as vaccine recommendations. Vaccines: ALL UTD Labs: fasting HP Prostate ca screening: average risk Joseph Stephens= as per latest guidelines, start screening at 33 yrs of age. Colon ca screening: average risk Joseph Stephens= as per latest guidelines, start screening at 57 yrs of age.  #2 left inguinal hernia, small. Asymptomatic. Observe.  #3 hypercholesterolemia.  Doing well on rosuvastatin 10 mg a day. Lipid panel and hepatic panel today.  #4  Hypothyroidism.  Doing well on 50 mcg levothyroxine daily. TSH today.  An After Visit Summary was printed and given to the Joseph Stephens.  FOLLOW UP:  Return in about 1 year (around 01/27/2024) for annual CPE (fasting).  Signed:  Crissie Sickles, MD           01/26/2023

## 2023-01-27 ENCOUNTER — Other Ambulatory Visit (HOSPITAL_COMMUNITY): Payer: Self-pay

## 2023-01-27 LAB — CBC
HCT: 43.8 % (ref 39.0–52.0)
Hemoglobin: 14.7 g/dL (ref 13.0–17.0)
MCHC: 33.5 g/dL (ref 30.0–36.0)
MCV: 88.2 fl (ref 78.0–100.0)
Platelets: 270 10*3/uL (ref 150.0–400.0)
RBC: 4.96 Mil/uL (ref 4.22–5.81)
RDW: 13.3 % (ref 11.5–15.5)
WBC: 7.1 10*3/uL (ref 4.0–10.5)

## 2023-01-27 LAB — LIPID PANEL
Cholesterol: 214 mg/dL — ABNORMAL HIGH (ref 0–200)
HDL: 63.3 mg/dL (ref >39.00)
LDL Cholesterol: 127 mg/dL — ABNORMAL HIGH (ref 0–99)
NonHDL: 150.34
Total CHOL/HDL Ratio: 3
Triglycerides: 117 mg/dL (ref 0.0–149.0)
VLDL: 23.4 mg/dL (ref 0.0–40.0)

## 2023-01-27 LAB — COMPREHENSIVE METABOLIC PANEL
ALT: 38 U/L (ref 0–53)
AST: 32 U/L (ref 0–37)
Albumin: 4.6 g/dL (ref 3.5–5.2)
Alkaline Phosphatase: 68 U/L (ref 39–117)
BUN: 16 mg/dL (ref 6–23)
CO2: 28 mEq/L (ref 19–32)
Calcium: 9.8 mg/dL (ref 8.4–10.5)
Chloride: 102 mEq/L (ref 96–112)
Creatinine, Ser: 1.05 mg/dL (ref 0.40–1.50)
GFR: 86.39 mL/min (ref >60.00)
Glucose, Bld: 84 mg/dL (ref 70–99)
Potassium: 4 mEq/L (ref 3.5–5.1)
Sodium: 139 mEq/L (ref 135–145)
Total Bilirubin: 0.6 mg/dL (ref 0.2–1.2)
Total Protein: 7.3 g/dL (ref 6.0–8.3)

## 2023-01-27 LAB — TSH: TSH: 2.98 u[IU]/mL (ref 0.35–5.50)

## 2023-02-01 ENCOUNTER — Other Ambulatory Visit (HOSPITAL_COMMUNITY): Payer: Self-pay

## 2023-06-14 ENCOUNTER — Other Ambulatory Visit (HOSPITAL_COMMUNITY): Payer: Self-pay

## 2023-10-27 DIAGNOSIS — M792 Neuralgia and neuritis, unspecified: Secondary | ICD-10-CM | POA: Diagnosis not present

## 2023-10-27 DIAGNOSIS — S92811A Other fracture of right foot, initial encounter for closed fracture: Secondary | ICD-10-CM | POA: Diagnosis not present

## 2023-11-20 DIAGNOSIS — M7751 Other enthesopathy of right foot: Secondary | ICD-10-CM | POA: Diagnosis not present

## 2023-12-07 ENCOUNTER — Other Ambulatory Visit (HOSPITAL_COMMUNITY): Payer: Self-pay

## 2023-12-22 DIAGNOSIS — D2372 Other benign neoplasm of skin of left lower limb, including hip: Secondary | ICD-10-CM | POA: Diagnosis not present

## 2023-12-22 DIAGNOSIS — L821 Other seborrheic keratosis: Secondary | ICD-10-CM | POA: Diagnosis not present

## 2023-12-22 DIAGNOSIS — D485 Neoplasm of uncertain behavior of skin: Secondary | ICD-10-CM | POA: Diagnosis not present

## 2023-12-22 DIAGNOSIS — L814 Other melanin hyperpigmentation: Secondary | ICD-10-CM | POA: Diagnosis not present

## 2023-12-22 DIAGNOSIS — D2371 Other benign neoplasm of skin of right lower limb, including hip: Secondary | ICD-10-CM | POA: Diagnosis not present

## 2024-01-31 ENCOUNTER — Encounter: Payer: BC Managed Care – PPO | Admitting: Family Medicine

## 2024-02-02 ENCOUNTER — Telehealth: Payer: Self-pay | Admitting: Family Medicine

## 2024-02-02 DIAGNOSIS — Z1211 Encounter for screening for malignant neoplasm of colon: Secondary | ICD-10-CM | POA: Diagnosis not present

## 2024-02-02 DIAGNOSIS — D123 Benign neoplasm of transverse colon: Secondary | ICD-10-CM | POA: Diagnosis not present

## 2024-02-02 NOTE — Telephone Encounter (Signed)
 Copied from CRM [id number]. Topic: Appointments - Scheduling Inquiry for Clinic >> Feb 02, 2024 10:32 AM Nila Nephew wrote: Reason for CRM: Previous patient to Dr. Milinda Cave and spouse of Guillaume Weninger DOB 03-22[birth year]85) wanting to establish care with Dr. Ashley Royalty.  Please advise

## 2024-02-06 NOTE — Telephone Encounter (Signed)
 Called patient to schedule an appointment left vm to call back and schedule

## 2024-02-16 ENCOUNTER — Telehealth: Payer: Self-pay

## 2024-02-16 NOTE — Telephone Encounter (Signed)
 Patient scheduled 03/05/2024. It was approved by Dr. Anastasio Auerbach. He's related to Craigory Toste DOB 03[birth year]22[birth year]85.  tvt

## 2024-02-16 NOTE — Telephone Encounter (Signed)
 I'm confused by this chain of messages. Pls clarify whether anything is needed by me or not. thx

## 2024-02-16 NOTE — Telephone Encounter (Signed)
 Copied from CRM [id number]. Topic: Appointments - Scheduling Inquiry for Clinic >> Feb 16, 2024 12:38 PM Santiya F wrote: Reason for CRM: Patient's wife is calling in returning a call from the office regarding scheduling him with Dr. Ashley Royalty as a new patient. Patient's wife is a current patient of Dr. Ashley Royalty.

## 2024-02-19 NOTE — Telephone Encounter (Signed)
 Nothing further needed, patient is transferring care

## 2024-03-05 ENCOUNTER — Ambulatory Visit (INDEPENDENT_AMBULATORY_CARE_PROVIDER_SITE_OTHER): Admitting: Family Medicine

## 2024-03-05 ENCOUNTER — Encounter: Payer: Self-pay | Admitting: Family Medicine

## 2024-03-05 VITALS — BP 126/82 | HR 80 | Ht 66.34 in | Wt 177.6 lb

## 2024-03-05 DIAGNOSIS — E039 Hypothyroidism, unspecified: Secondary | ICD-10-CM | POA: Diagnosis not present

## 2024-03-05 DIAGNOSIS — E785 Hyperlipidemia, unspecified: Secondary | ICD-10-CM | POA: Diagnosis not present

## 2024-03-05 DIAGNOSIS — Z23 Encounter for immunization: Secondary | ICD-10-CM | POA: Diagnosis not present

## 2024-03-05 DIAGNOSIS — E559 Vitamin D deficiency, unspecified: Secondary | ICD-10-CM

## 2024-03-05 NOTE — Assessment & Plan Note (Signed)
 Updating vitamin D levels.

## 2024-03-05 NOTE — Assessment & Plan Note (Addendum)
 Previously treated with Crestor however is no longer taking this.  He has been working on dietary changes.  Updated lipid panel ordered.

## 2024-03-05 NOTE — Progress Notes (Signed)
 Joseph Stephens - 48 y.o. male MRN [id number]  Date of birth: Aug 27, 1976  Subjective No chief complaint on file.   HPI Joseph Stephens is a 48 year old male here today for initial visit to establish care.  He has history of hypothyroidism, migraines hyperlipidemia.  He has been prescribed levothyroxine for management of hypothyroidism and is consistent with taking this.  Prescribed rosuvastatin for longer taking this.  He has been working on dietary changes to help with management of this.  He does not have any new concerns at this time.  ROS:  A comprehensive ROS was completed and negative except as noted per HPI  No Known Allergies  Past Medical History:  Diagnosis Date   Allergic rhinitis    History of kidney stones    Hyperlipemia, mixed    dx'd approx age 28 (+FH)   Hypothyroidism dx'd approx age 82   Migraine syndrome    Recurrent HSV (herpes simplex virus)    Ulnar neuropathy of right upper extremity    x years, unknown etiology, big w/u was done but no site of entrapment was determined.    Vitamin D deficiency     Past Surgical History:  Procedure Laterality Date   COLONOSCOPY  2012   Done for weight loss.  Normal.  Weight loss turned out to be secondary to severe stress   NOSE SURGERY     rhinoplasty/septoplasty    Social History   Socioeconomic History   Marital status: Married    Spouse name: Not on file   Number of children: Not on file   Years of education: Not on file   Highest education level: Master's degree (e.g., MA, MS, MEng, MEd, MSW, MBA)  Occupational History   Not on file  Tobacco Use   Smoking status: Never   Smokeless tobacco: Never  Substance and Sexual Activity   Alcohol use: Yes    Alcohol/week: 2.0 - 3.0 standard drinks of alcohol    Types: 2 - 3 Glasses of wine per week    Comment: occasionally   Drug use: Never   Sexual activity: Yes  Other Topics Concern   Not on file  Social History Narrative   Married, 1 daughter (37 y/o as of  07/2019).   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   Orig from [place].   No tob.   Alc: occ.   Social Drivers of Corporate investment banker Strain: Low Risk  (01/26/2023)   Overall Financial Resource Strain (CARDIA)    Difficulty of Paying Living Expenses: Not hard at all  Food Insecurity: No Food Insecurity (01/26/2023)   Hunger Vital Sign    Worried About Running Out of Food in the Last Year: Never true    Ran Out of Food in the Last Year: Never true  Transportation Needs: No Transportation Needs (01/26/2023)   PRAPARE - Administrator, Civil Service (Medical): No    Lack of Transportation (Non-Medical): No  Physical Activity: Sufficiently Active (01/26/2023)   Exercise Vital Sign    Days of Exercise per Week: 4 days    Minutes of Exercise per Session: 60 min  Stress: No Stress Concern Present (01/26/2023)   [employer] of Occupational Health - Occupational Stress Questionnaire    Feeling of Stress : Not at all  Social Connections: Moderately Integrated (01/26/2023)   Social Connection and Isolation Panel [NHANES]  Frequency of Communication with Friends and Family: More than three times a week    Frequency of Social Gatherings with Friends and Family: Twice a week    Attends Religious Services: More than 4 times per year    Active Member of [employer] or Organizations: No    Attends Engineer, structural: Not on file    Marital Status: Married    Family History  Problem Relation Age of Onset   Depression Mother    Drug abuse Mother    Mental illness Mother    High Cholesterol Father    High blood pressure Father    Alcohol abuse Maternal Grandfather    Heart attack Paternal Grandmother    Heart disease Paternal Grandmother    Pancreatic cancer Paternal Grandfather     Health Maintenance  Topic Date Due   HIV Screening  Never done   Hepatitis C  Screening  Never done   DTaP/Tdap/Td (3 - Td or Tdap) 05/29/2023   INFLUENZA VACCINE  07/13/2023   COVID-19 Vaccine (5 - 2024-25 season) 08/13/2023   Colonoscopy  02/01/2034   HPV VACCINES  Aged Out     ----------------------------------------------------------------------------------------------------------------------------------------------------------------------------------------------------------------- Physical Exam There were no vitals taken for this visit.  Physical Exam Constitutional:      Appearance: Normal appearance.  Neurological:     Mental Status: He is alert.  Psychiatric:        Mood and Affect: Mood normal.        Behavior: Behavior normal.     ------------------------------------------------------------------------------------------------------------------------------------------------------------------------------------------------------------------- Assessment and Plan  Acquired hypothyroidism Updated TSH ordered.  Feels good at current strength of levothyroxine.  Dyslipidemia Previously treated with Crestor however is no longer taking this.  He has been working on dietary changes.  Updated lipid panel ordered.  Vitamin D deficiency Updating vitamin D levels.   No orders of the defined types were placed in this encounter.   No follow-ups on file.    This visit occurred during the SARS-CoV-2 public health emergency.  Safety protocols were in place, including screening questions prior to the visit, additional usage of staff PPE, and extensive cleaning of exam room while observing appropriate contact time as indicated for disinfecting solutions.

## 2024-03-05 NOTE — Patient Instructions (Signed)
 Nice to meet you.   Have labs completed when fasting prior to next visit so that we can review at your visit.

## 2024-03-05 NOTE — Assessment & Plan Note (Signed)
 Updated TSH ordered.  Feels good at current strength of levothyroxine.

## 2024-03-06 ENCOUNTER — Encounter: Payer: Self-pay | Admitting: Family Medicine

## 2024-03-06 NOTE — Addendum Note (Signed)
 Addended by: Ardyth Man on: 03/06/2024 03:51 PM   Modules accepted: Orders

## 2024-03-22 ENCOUNTER — Encounter: Admitting: Family Medicine

## 2024-04-11 ENCOUNTER — Encounter: Admitting: Family Medicine

## 2024-04-22 ENCOUNTER — Encounter: Admitting: Family Medicine

## 2024-05-02 ENCOUNTER — Other Ambulatory Visit: Payer: Self-pay

## 2024-05-02 ENCOUNTER — Encounter: Payer: Self-pay | Admitting: Family Medicine

## 2024-05-02 ENCOUNTER — Other Ambulatory Visit: Payer: Self-pay | Admitting: Family Medicine

## 2024-05-02 ENCOUNTER — Other Ambulatory Visit (HOSPITAL_COMMUNITY): Payer: Self-pay

## 2024-05-02 MED ORDER — LEVOTHYROXINE SODIUM 50 MCG PO TABS
50.0000 ug | ORAL_TABLET | Freq: Every day | ORAL | 0 refills | Status: DC
  Filled 2024-05-02: qty 30, 30d supply, fill #0

## 2024-05-03 ENCOUNTER — Other Ambulatory Visit (HOSPITAL_COMMUNITY): Payer: Self-pay

## 2024-05-08 ENCOUNTER — Ambulatory Visit (INDEPENDENT_AMBULATORY_CARE_PROVIDER_SITE_OTHER): Admitting: Family Medicine

## 2024-05-08 VITALS — BP 134/85 | HR 69 | Ht 66.34 in | Wt 173.0 lb

## 2024-05-08 DIAGNOSIS — E559 Vitamin D deficiency, unspecified: Secondary | ICD-10-CM | POA: Diagnosis not present

## 2024-05-08 DIAGNOSIS — E039 Hypothyroidism, unspecified: Secondary | ICD-10-CM | POA: Diagnosis not present

## 2024-05-08 DIAGNOSIS — E785 Hyperlipidemia, unspecified: Secondary | ICD-10-CM | POA: Diagnosis not present

## 2024-05-08 DIAGNOSIS — Z0000 Encounter for general adult medical examination without abnormal findings: Secondary | ICD-10-CM | POA: Insufficient documentation

## 2024-05-08 NOTE — Progress Notes (Signed)
 Joseph Stephens - 48 y.o. male MRN [id number]  Date of birth: 1976-11-11  Subjective Chief Complaint  Patient presents with   Annual Exam    HPI Joseph Stephens is a 48 y.o. male here today for annual exam.    He is doing well.    He is staying quite active.  He feels that diet is pretty good.   He is a non-smoker.  Occasional EtOH use.   Review of Systems  Constitutional:  Negative for chills, fever, malaise/fatigue and weight loss.  HENT:  Negative for congestion, ear pain and sore throat.   Eyes:  Negative for blurred vision, double vision and pain.  Respiratory:  Negative for cough and shortness of breath.   Cardiovascular:  Negative for chest pain and palpitations.  Gastrointestinal:  Negative for abdominal pain, blood in stool, constipation, heartburn and nausea.  Genitourinary:  Negative for dysuria and urgency.  Musculoskeletal:  Negative for joint pain and myalgias.  Neurological:  Negative for dizziness and headaches.  Endo/Heme/Allergies:  Does not bruise/bleed easily.  Psychiatric/Behavioral:  Negative for depression. The patient is not nervous/anxious and does not have insomnia.     No Known Allergies  Past Medical History:  Diagnosis Date   Allergic rhinitis    History of kidney stones    Hyperlipemia, mixed    dx'd approx age 70 (+FH)   Hypothyroidism dx'd approx age 51   Migraine syndrome    Recurrent HSV (herpes simplex virus)    Ulnar neuropathy of right upper extremity    x years, unknown etiology, big w/u was done but no site of entrapment was determined.    Vitamin D  deficiency     Past Surgical History:  Procedure Laterality Date   COLONOSCOPY  2012   Done for weight loss.  Normal.  Weight loss turned out to be secondary to severe stress   NOSE SURGERY     rhinoplasty/septoplasty    Social History   Socioeconomic History   Marital status: Married    Spouse name: Not on file   Number of children: Not on file   Years of education: Not on  file   Highest education level: Master's degree (e.g., MA, MS, MEng, MEd, MSW, MBA)  Occupational History   Not on file  Tobacco Use   Smoking status: Never    Passive exposure: Never   Smokeless tobacco: Never  Vaping Use   Vaping status: Never Used  Substance and Sexual Activity   Alcohol use: Not Currently    Alcohol/week: 2.0 - 3.0 standard drinks of alcohol    Types: 2 - 3 Glasses of wine per week    Comment: occasionally   Drug use: Never   Sexual activity: Yes    Partners: Female  Other Topics Concern   Not on file  Social History Narrative   Married, 1 daughter (65 y/o as of 07/2019).   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   Orig from [place].   No tob.   Alc: occ.   Social Drivers of Corporate investment banker Strain: Low Risk  (05/08/2024)   Overall Financial Resource Strain (CARDIA)    Difficulty of Paying Living Expenses: Not hard at all  Food Insecurity: No Food Insecurity (05/08/2024)   Hunger Vital Sign    Worried About Running Out of Food in the Last Year: Never true    Ran Out  of Food in the Last Year: Never true  Transportation Needs: No Transportation Needs (05/08/2024)   PRAPARE - Administrator, Civil Service (Medical): No    Lack of Transportation (Non-Medical): No  Physical Activity: Sufficiently Active (05/08/2024)   Exercise Vital Sign    Days of Exercise per Week: 4 days    Minutes of Exercise per Session: 60 min  Stress: No Stress Concern Present (05/08/2024)   [employer] of Occupational Health - Occupational Stress Questionnaire    Feeling of Stress : Not at all  Social Connections: Socially Integrated (05/08/2024)   Social Connection and Isolation Panel [NHANES]    Frequency of Communication with Friends and Family: More than three times a week    Frequency of Social Gatherings with Friends and Family: Once a week    Attends  Religious Services: More than 4 times per year    Active Member of Clubs or Organizations: Yes    Attends Banker Meetings: 1 to 4 times per year    Marital Status: Married    Family History  Problem Relation Age of Onset   Depression Mother    Drug abuse Mother    Mental illness Mother    Alcohol abuse Mother    High Cholesterol Father    High blood pressure Father    Alcohol abuse Maternal Grandfather    Heart attack Paternal Grandmother    Heart disease Paternal Grandmother    Pancreatic cancer Paternal Grandfather     Health Maintenance  Topic Date Due   HIV Screening  Never done   Hepatitis C Screening  Never done   COVID-19 Vaccine (5 - 2024-25 season) 08/13/2023   INFLUENZA VACCINE  07/12/2024   Colonoscopy  02/01/2034   DTaP/Tdap/Td (4 - Td or Tdap) 03/05/2034   HPV VACCINES  Aged Out   Meningococcal B Vaccine  Aged Out     ----------------------------------------------------------------------------------------------------------------------------------------------------------------------------------------------------------------- Physical Exam BP 134/85 (BP Location: Left Arm, Patient Position: Sitting, Cuff Size: Normal)   Pulse 69   Ht 5' 6.34" (1.685 m)   Wt 173 lb (78.5 kg)   SpO2 98%   BMI 27.64 kg/m   Physical Exam Constitutional:      General: He is not in acute distress. HENT:     Head: Normocephalic and atraumatic.     Right Ear: Tympanic membrane and external ear normal.     Left Ear: Tympanic membrane and external ear normal.  Eyes:     General: No scleral icterus. Neck:     Thyroid : No thyromegaly.  Cardiovascular:     Rate and Rhythm: Normal rate and regular rhythm.     Heart sounds: Normal heart sounds.  Pulmonary:     Effort: Pulmonary effort is normal.     Breath sounds: Normal breath sounds.  Abdominal:     General: Bowel sounds are normal. There is no distension.     Palpations: Abdomen is soft.     Tenderness:  There is no abdominal tenderness. There is no guarding.  Musculoskeletal:     Cervical back: Normal range of motion.  Lymphadenopathy:     Cervical: No cervical adenopathy.  Skin:    General: Skin is warm and dry.     Findings: No rash.  Neurological:     Mental Status: He is alert and oriented to person, place, and time.     Cranial Nerves: No cranial nerve deficit.     Motor: No abnormal muscle tone.  Psychiatric:  Mood and Affect: Mood normal.        Behavior: Behavior normal.     ------------------------------------------------------------------------------------------------------------------------------------------------------------------------------------------------------------------- Assessment and Plan  Well adult exam Well adult No orders of the defined types were placed in this encounter. Screenings: per lab orders Immunizations:  UTD Anticipatory guidance/Risk factor reduction:  Recommendations per AVS   No orders of the defined types were placed in this encounter.   No follow-ups on file.

## 2024-05-08 NOTE — Assessment & Plan Note (Signed)
 Well adult No orders of the defined types were placed in this encounter. Screenings: per lab orders Immunizations: UTD Anticipatory guidance/Risk factor reduction:  Recommendations per AVS.

## 2024-05-08 NOTE — Patient Instructions (Signed)

## 2024-05-09 LAB — CBC WITH DIFFERENTIAL/PLATELET
Basophils Absolute: 0 10*3/uL (ref 0.0–0.2)
Basos: 0 %
EOS (ABSOLUTE): 0.2 10*3/uL (ref 0.0–0.4)
Eos: 2 %
Hematocrit: 43.1 % (ref 37.5–51.0)
Hemoglobin: 14.3 g/dL (ref 13.0–17.7)
Immature Grans (Abs): 0 10*3/uL (ref 0.0–0.1)
Immature Granulocytes: 1 %
Lymphocytes Absolute: 1.9 10*3/uL (ref 0.7–3.1)
Lymphs: 23 %
MCH: 29.5 pg (ref 26.6–33.0)
MCHC: 33.2 g/dL (ref 31.5–35.7)
MCV: 89 fL (ref 79–97)
Monocytes Absolute: 0.6 10*3/uL (ref 0.1–0.9)
Monocytes: 8 %
Neutrophils Absolute: 5.5 10*3/uL (ref 1.4–7.0)
Neutrophils: 66 %
Platelets: 255 10*3/uL (ref 150–450)
RBC: 4.84 x10E6/uL (ref 4.14–5.80)
RDW: 13.2 % (ref 11.6–15.4)
WBC: 8.4 10*3/uL (ref 3.4–10.8)

## 2024-05-09 LAB — LIPID PANEL WITH LDL/HDL RATIO
Cholesterol, Total: 195 mg/dL (ref 100–199)
HDL: 56 mg/dL (ref >39)
LDL Chol Calc (NIH): 111 mg/dL — ABNORMAL HIGH (ref 0–99)
LDL/HDL Ratio: 2 ratio (ref 0.0–3.6)
Triglycerides: 158 mg/dL — ABNORMAL HIGH (ref 0–149)
VLDL Cholesterol Cal: 28 mg/dL (ref 5–40)

## 2024-05-09 LAB — CMP14+EGFR
ALT: 27 IU/L (ref 0–44)
AST: 20 IU/L (ref 0–40)
Albumin: 4.7 g/dL (ref 4.1–5.1)
Alkaline Phosphatase: 71 IU/L (ref 44–121)
BUN/Creatinine Ratio: 13 (ref 9–20)
BUN: 13 mg/dL (ref 6–24)
Bilirubin Total: 0.5 mg/dL (ref 0.0–1.2)
CO2: 22 mmol/L (ref 20–29)
Calcium: 9.6 mg/dL (ref 8.7–10.2)
Chloride: 101 mmol/L (ref 96–106)
Creatinine, Ser: 1.03 mg/dL (ref 0.76–1.27)
Globulin, Total: 2.5 g/dL (ref 1.5–4.5)
Glucose: 100 mg/dL — ABNORMAL HIGH (ref 70–99)
Potassium: 4.2 mmol/L (ref 3.5–5.2)
Sodium: 140 mmol/L (ref 134–144)
Total Protein: 7.2 g/dL (ref 6.0–8.5)
eGFR: 91 mL/min/{1.73_m2} (ref >59)

## 2024-05-09 LAB — TSH: TSH: 2.49 u[IU]/mL (ref 0.450–4.500)

## 2024-05-09 LAB — VITAMIN D 25 HYDROXY (VIT D DEFICIENCY, FRACTURES): Vit D, 25-Hydroxy: 45.5 ng/mL (ref 30.0–100.0)

## 2024-05-10 ENCOUNTER — Ambulatory Visit: Payer: Self-pay | Admitting: Family Medicine

## 2024-06-04 ENCOUNTER — Other Ambulatory Visit: Payer: Self-pay | Admitting: Family Medicine

## 2024-06-04 ENCOUNTER — Other Ambulatory Visit (HOSPITAL_COMMUNITY): Payer: Self-pay

## 2024-06-04 MED ORDER — LEVOTHYROXINE SODIUM 50 MCG PO TABS
50.0000 ug | ORAL_TABLET | Freq: Every day | ORAL | 3 refills | Status: AC
  Filled 2024-06-04: qty 90, 90d supply, fill #0
  Filled 2024-09-17: qty 90, 90d supply, fill #1
  Filled 2025-01-08: qty 90, 90d supply, fill #2

## 2024-09-17 ENCOUNTER — Other Ambulatory Visit (HOSPITAL_COMMUNITY): Payer: Self-pay

## 2024-09-17 ENCOUNTER — Other Ambulatory Visit: Payer: Self-pay

## 2024-09-17 ENCOUNTER — Other Ambulatory Visit: Payer: Self-pay | Admitting: Family Medicine

## 2024-09-17 MED ORDER — ROSUVASTATIN CALCIUM 10 MG PO TABS
10.0000 mg | ORAL_TABLET | Freq: Every day | ORAL | 3 refills | Status: AC
  Filled 2024-09-17: qty 90, 90d supply, fill #0
  Filled 2025-01-10: qty 90, 90d supply, fill #1

## 2024-09-24 ENCOUNTER — Ambulatory Visit: Admitting: Family Medicine

## 2024-10-03 ENCOUNTER — Ambulatory Visit: Admitting: Family Medicine

## 2025-01-08 ENCOUNTER — Other Ambulatory Visit: Payer: Self-pay

## 2025-01-09 ENCOUNTER — Other Ambulatory Visit (HOSPITAL_COMMUNITY): Payer: Self-pay

## 2025-01-10 ENCOUNTER — Other Ambulatory Visit (HOSPITAL_COMMUNITY): Payer: Self-pay

## 2025-01-10 ENCOUNTER — Encounter: Payer: Self-pay | Admitting: Family Medicine

## 2025-01-10 ENCOUNTER — Other Ambulatory Visit: Payer: Self-pay | Admitting: Family Medicine

## 2025-01-10 DIAGNOSIS — E039 Hypothyroidism, unspecified: Secondary | ICD-10-CM

## 2025-05-09 ENCOUNTER — Encounter: Admitting: Family Medicine
